# Patient Record
Sex: Female | Born: 1970 | Race: White | Hispanic: No | Marital: Single | Smoking: Former smoker
Health system: Southern US, Community
[De-identification: ages and names within clinical notes are randomized; demographics above are authoritative.]

## PROBLEM LIST (undated history)

## (undated) DIAGNOSIS — W57XXXA Bitten or stung by nonvenomous insect and other nonvenomous arthropods, initial encounter: Secondary | ICD-10-CM

## (undated) DIAGNOSIS — M722 Plantar fascial fibromatosis: Secondary | ICD-10-CM

## (undated) DIAGNOSIS — E669 Obesity, unspecified: Secondary | ICD-10-CM

## (undated) DIAGNOSIS — I10 Essential (primary) hypertension: Secondary | ICD-10-CM

## (undated) DIAGNOSIS — O039 Complete or unspecified spontaneous abortion without complication: Secondary | ICD-10-CM

## (undated) HISTORY — DX: Plantar fascial fibromatosis: M72.2

## (undated) HISTORY — DX: Complete or unspecified spontaneous abortion without complication: O03.9

## (undated) HISTORY — DX: Essential (primary) hypertension: I10

## (undated) HISTORY — DX: Obesity, unspecified: E66.9

---

## 2000-08-30 ENCOUNTER — Other Ambulatory Visit: Admission: RE | Admit: 2000-08-30 | Discharge: 2000-08-30 | Payer: Self-pay | Admitting: Obstetrics and Gynecology

## 2003-01-22 ENCOUNTER — Other Ambulatory Visit: Admission: RE | Admit: 2003-01-22 | Discharge: 2003-01-22 | Payer: Self-pay | Admitting: Obstetrics & Gynecology

## 2003-07-11 ENCOUNTER — Emergency Department (HOSPITAL_COMMUNITY): Admission: EM | Admit: 2003-07-11 | Discharge: 2003-07-11 | Payer: Self-pay | Admitting: Emergency Medicine

## 2004-12-18 ENCOUNTER — Other Ambulatory Visit: Admission: RE | Admit: 2004-12-18 | Discharge: 2004-12-18 | Payer: Self-pay | Admitting: Gynecology

## 2006-09-29 ENCOUNTER — Emergency Department (HOSPITAL_COMMUNITY): Admission: EM | Admit: 2006-09-29 | Discharge: 2006-09-29 | Payer: Self-pay | Admitting: Emergency Medicine

## 2009-04-30 ENCOUNTER — Ambulatory Visit (HOSPITAL_COMMUNITY): Admission: RE | Admit: 2009-04-30 | Discharge: 2009-04-30 | Payer: Self-pay | Admitting: Obstetrics and Gynecology

## 2009-06-17 ENCOUNTER — Inpatient Hospital Stay (HOSPITAL_COMMUNITY): Admission: AD | Admit: 2009-06-17 | Discharge: 2009-06-17 | Payer: Self-pay | Admitting: Obstetrics & Gynecology

## 2009-08-18 ENCOUNTER — Inpatient Hospital Stay (HOSPITAL_COMMUNITY): Admission: AD | Admit: 2009-08-18 | Discharge: 2009-08-18 | Payer: Self-pay | Admitting: Obstetrics and Gynecology

## 2009-09-05 ENCOUNTER — Inpatient Hospital Stay (HOSPITAL_COMMUNITY): Admission: AD | Admit: 2009-09-05 | Discharge: 2009-09-13 | Payer: Self-pay | Admitting: Obstetrics and Gynecology

## 2009-09-11 ENCOUNTER — Encounter (INDEPENDENT_AMBULATORY_CARE_PROVIDER_SITE_OTHER): Payer: Self-pay | Admitting: Obstetrics and Gynecology

## 2010-04-26 ENCOUNTER — Encounter: Payer: Self-pay | Admitting: Family Medicine

## 2010-06-22 LAB — CBC
HCT: 39.9 % (ref 36.0–46.0)
HCT: 28.2 % — ABNORMAL LOW (ref 36.0–46.0)
HCT: 29.8 % — ABNORMAL LOW (ref 36.0–46.0)
Hemoglobin: 13.5 g/dL (ref 12.0–15.0)
Hemoglobin: 10.5 g/dL — ABNORMAL LOW (ref 12.0–15.0)
Hemoglobin: 11.9 g/dL — ABNORMAL LOW (ref 12.0–15.0)
MCHC: 33.9 g/dL (ref 30.0–36.0)
MCV: 94.8 fL (ref 78.0–100.0)
MCV: 95.3 fL (ref 78.0–100.0)
Platelets: 328 10*3/uL (ref 150–400)
Platelets: 347 10*3/uL (ref 150–400)
Platelets: 415 10*3/uL — ABNORMAL HIGH (ref 150–400)
RBC: 3.15 MIL/uL — ABNORMAL LOW (ref 3.87–5.11)
RDW: 14.2 % (ref 11.5–15.5)
WBC: 9.7 10*3/uL (ref 4.0–10.5)
WBC: 13.3 10*3/uL — ABNORMAL HIGH (ref 4.0–10.5)
WBC: 17.2 10*3/uL — ABNORMAL HIGH (ref 4.0–10.5)
WBC: 19.8 10*3/uL — ABNORMAL HIGH (ref 4.0–10.5)

## 2010-06-22 LAB — STREP B DNA PROBE

## 2010-06-29 LAB — URINALYSIS, ROUTINE W REFLEX MICROSCOPIC
Bilirubin Urine: NEGATIVE
Glucose, UA: NEGATIVE mg/dL
Hgb urine dipstick: NEGATIVE
pH: 7 (ref 5.0–8.0)

## 2010-06-29 LAB — FETAL FIBRONECTIN: Fetal Fibronectin: NEGATIVE

## 2010-11-20 ENCOUNTER — Emergency Department (HOSPITAL_COMMUNITY)
Admission: EM | Admit: 2010-11-20 | Discharge: 2010-11-20 | Disposition: A | Discharge status: Home / Self Care | Payer: Self-pay | Attending: Emergency Medicine | Admitting: Emergency Medicine

## 2010-11-20 DIAGNOSIS — R209 Unspecified disturbances of skin sensation: Secondary | ICD-10-CM | POA: Insufficient documentation

## 2010-11-20 DIAGNOSIS — M79609 Pain in unspecified limb: Secondary | ICD-10-CM | POA: Insufficient documentation

## 2010-11-20 DIAGNOSIS — G5620 Lesion of ulnar nerve, unspecified upper limb: Secondary | ICD-10-CM | POA: Insufficient documentation

## 2010-12-03 NOTE — Consult Note (Signed)
  NAMESKYLEE, Sierra Moreno                ACCOUNT NO.:  [id number]  MEDICAL RECORD NO.:  [id number]  LOCATION:  [facility]                         FACILITY:  [facility]  PHYSICIAN:  Juleen China IV, MDDATE OF BIRTH:  06/21/1970  DATE OF CONSULTATION:  11/20/2010 DATE OF DISCHARGE:  11/20/2010                                CONSULTATION   REASON FOR CONSULT:  Possible vascular occlusion.  CONSULTING SERVICE:  Emergency Department.  HISTORY:  This is a 40 year old female who initially presented to [facility] with complaints of her left hand being numb and arm pain.  There is a family history of vascular disease so she was transferred to [facility] for further evaluation.  The patient states that when she woke up this morning her hand was numb and she had severe pain. She is having trouble moving her arm.  She has never had any histories like this before.  There were no aggravating or relieving symptoms.  She called 9-1-1 was taken by EMS to [facility] and was subsequently transferred to [facility].  Upon arrival here, her symptoms have nearly resolved.  She does complain of swelling in her arm.  She denies having any chest pain.  She denies any shortness of breath.  REVIEW OF SYSTEMS:  Negative for fevers, chills.  Negative for shortness of breath and chest pain.  All other review of systems are negative except what is documented in the HPI.  PAST MEDICAL HISTORY:  None.  ALLERGIES:  None.  SOCIAL HISTORY:  Positive for tobacco.  FAMILY HISTORY:  Positive for congestive heart failure and coronary artery disease in her mother.  MEDICATIONS:  None.  PHYSICAL EXAMINATION:  VITAL SIGNS:  She is afebrile.  Hemodynamically stable. GENERAL:  She is well-appearing in no acute distress. HEENT:  Within normal limits. RESPIRATIONS:  Nonlabored. CARDIOVASCULAR:  Heart rhythm is regular.  She has palpable radial pulse and palpable pedal pulses. EXTREMITIES:  Mild left arm edema. NEUROLOGIC:   Normal grip strength in the left arm and normal sensation. SKIN:  Without rash.  DIAGNOSTIC STUDIES:  I ordered an arterial and venous duplex ultrasound. The left upper arm venous study shows no evidence of DVT or superficial thrombus.  Left upper extremity arterial study shows triphasic waveforms in the radial and ulnar arteries.  Digital tracings are similar.  ASSESSMENT/PLAN:  Left arm pain:  I do not believe her symptoms are vascular in etiology.  With normal arterial tracings and a negative venous study, I think this is not a vascular issue.  I  discussed with her the importance of smoking cessation.  I will defer her to the emergency department for further workup and evaluation as deemed necessary.     Jorge Ny, MD     VWB/MEDQ  D:  11/20/2010  T:  11/21/2010  Job:  [id number]  Electronically Signed by Arelia Longest IV MD on 12/03/2010 12:05:41 AM

## 2011-04-09 ENCOUNTER — Ambulatory Visit (INDEPENDENT_AMBULATORY_CARE_PROVIDER_SITE_OTHER): Payer: BC Managed Care – PPO | Admitting: Surgery

## 2011-04-12 ENCOUNTER — Encounter (INDEPENDENT_AMBULATORY_CARE_PROVIDER_SITE_OTHER): Payer: Self-pay | Admitting: Surgery

## 2012-06-14 ENCOUNTER — Encounter (INDEPENDENT_AMBULATORY_CARE_PROVIDER_SITE_OTHER): Payer: Self-pay | Admitting: Surgery

## 2012-06-14 ENCOUNTER — Other Ambulatory Visit (INDEPENDENT_AMBULATORY_CARE_PROVIDER_SITE_OTHER): Payer: Self-pay

## 2012-06-14 ENCOUNTER — Ambulatory Visit (INDEPENDENT_AMBULATORY_CARE_PROVIDER_SITE_OTHER): Payer: BC Managed Care – PPO | Admitting: Surgery

## 2012-06-14 ENCOUNTER — Other Ambulatory Visit (INDEPENDENT_AMBULATORY_CARE_PROVIDER_SITE_OTHER): Payer: BC Managed Care – PPO

## 2012-06-14 DIAGNOSIS — Z6841 Body Mass Index (BMI) 40.0 and over, adult: Secondary | ICD-10-CM

## 2012-06-14 DIAGNOSIS — Z1231 Encounter for screening mammogram for malignant neoplasm of breast: Secondary | ICD-10-CM

## 2012-06-14 NOTE — Progress Notes (Signed)
Re:   Sierra Moreno DOB:   1970-08-16 MRN:   [id number]  ASSESSMENT AND PLAN: 1.  Morbid obesity, Weight - 307, BMI - 54.5  Per the 1991 NIH Consensus Statement, the patient is a candidate for bariatric surgery.  The patient attended our initial information session and reviewed the types of bariatric surgery.    The patient is interested in the Roux en Y Gastric Bypass.  I discussed with the patient the indications and risks of bariatric surgery.  The potential risks of surgery include, but are not limited to, bleeding, infection, leak from the bowel, DVT and PE, open surgery, long term nutrition consequences, and death.  The patient understands the importance of compliance and long term follow-up with our group after surgery.  From here we will obtain lab tests (labs done at Dr. Cynda Acres), x-rays, nutrition consult, and psych consult.  2.  Plantar fasciitis  3.  Smokes 1/2 ppd  Has to quit before surgery  Chief Complaint  Patient presents with  . New Evaluation    gastric bypass initial   REFERRING PHYSICIAN: NNODI, ADAKU, MD  HISTORY OF PRESENT ILLNESS: Sierra Moreno is a 42 y.o. (DOB: 09-24-1970)  white  female whose primary care physician is NNODI, ADAKU, MD and comes to me today for evaluation for bariatric surgery.  Her aunt Halina Andreas is with her.  The patient has been to our information sessions 3 times. She has, however, not heard me talk.  She is interested in a gastric bypass. She has tried multiple diets in her adult life. She has tried Weight Watchers, Tops, Slim fast, and other low-calorie diets. She was on Phen-Fen which she lost 100 pounds. She also took Product/process development scientist (energy pills - ephedrine) in 2003 and successfully lost about 100 pounds. She has tried phentermine without success.  She recently had blood work at Dr. Langston Reusing office. It sounds like she eats out a lot - particularly at fast food places. She said that she would quit smoking before her child was born, but was not  able to do it.    Past Medical History  Diagnosis Date  . Miscarriage 2000,2001  . Premature birth 2011    34 Weeks.  . Plantar fasciitis, bilateral     With heel spurs.  . Obesity      History reviewed. No pertinent past surgical history.    Current Outpatient Prescriptions  Medication Sig Dispense Refill  . Cholecalciferol (VITAMIN D) 2000 UNITS tablet Take 2,000 Units by mouth daily.      . diclofenac (VOLTAREN) 75 MG EC tablet Take 75 mg by mouth 2 (two) times daily.      . fluconazole (DIFLUCAN) 150 MG tablet Take 150 mg by mouth once. Once weekly X 8 weeks.      . Multiple Vitamins-Minerals (MULTIVITAMIN WITH MINERALS) tablet Take 1 tablet by mouth daily.       No current facility-administered medications for this visit.      Allergies  Allergen Reactions  . Lamisil (Terbinafine Hcl) Hives    REVIEW OF SYSTEMS: Skin:  No history of rash.  No history of abnormal moles. Infection:  No history of hepatitis or HIV.  No history of MRSA. Neurologic:  No history of stroke.  No history of seizure.  No history of headaches. Cardiac:  No history of hypertension. No history of heart disease.  No history of prior cardiac catheterization.  No history of seeing a cardiologist. Pulmonary:  Smokes and knows that she needs  to quit.  Endocrine:  No diabetes. No thyroid disease. Gastrointestinal:  No history of stomach disease.  No history of liver disease.  No history of gall bladder disease.  No history of pancreas disease.  No history of colon disease.  She has had some hemorrhoids. Urologic:  No history of kidney stones.  No history of bladder infections. Musculoskeletal:  History of back issues, which are coming back.  Issues with her feet - she sees [employer], Risk analyst, in [place]. Hematologic:  No bleeding disorder.  No history of anemia.  Not anticoagulated. Psycho-social:  The patient is oriented.   The patient has no obvious psychologic or social impairment to  understanding our conversation and plan.  SOCIAL and FAMILY HISTORY: Divorced. Has one daughter, 2 1/2. Her aunt Halina Andreas is with her.  PHYSICAL EXAM: BP 132/84  Pulse 80  Temp(Src) 98.9 F (37.2 C) (Temporal)  Resp 18  Ht 5\' 3"  (1.6 m)  Wt 307 lb 6.4 oz (139.436 kg)  BMI 54.47 kg/m2  General: WN obese WF who is alert and generally healthy appearing.  HEENT: Normal. Pupils equal. Neck: Supple. No mass.  No thyroid mass. Lymph Nodes:  No supraclavicular or cervical nodes. Lungs: Clear to auscultation and symmetric breath sounds. Heart:  RRR. No murmur or rub. Abdomen: Soft. No mass. No tenderness. No hernia. Normal bowel sounds.  No abdominal scars.  Slight more apple than pear. Rectal: External hemorrhoids.  Guaiac negative. Extremities:  Good strength and ROM  in upper and lower extremities. Neurologic:  Grossly intact to motor and sensory function. Psychiatric: Has normal mood and affect. Behavior is normal.   DATA REVIEWED: None in Epic.  Ovidio Kin, MD,  [facility] [address]  [address]   [place], [place]    [address] Phone:  [phone] FAX:  [phone]

## 2012-06-26 ENCOUNTER — Encounter (INDEPENDENT_AMBULATORY_CARE_PROVIDER_SITE_OTHER): Payer: Self-pay

## 2012-06-28 ENCOUNTER — Encounter (HOSPITAL_COMMUNITY)
Admission: RE | Disposition: A | Discharge status: Home / Self Care | Payer: Self-pay | Source: Ambulatory Visit | Attending: Surgery

## 2012-06-28 ENCOUNTER — Ambulatory Visit (HOSPITAL_COMMUNITY)
Admission: RE | Admit: 2012-06-28 | Discharge: 2012-06-28 | Disposition: A | Discharge status: Home / Self Care | Payer: BC Managed Care – PPO | Source: Ambulatory Visit | Attending: Surgery | Admitting: Surgery

## 2012-06-28 HISTORY — PX: BREATH TEK H PYLORI: SHX5422

## 2012-06-28 LAB — COMPREHENSIVE METABOLIC PANEL
ALT: 20 U/L (ref 0–35)
AST: 24 U/L (ref 0–37)
Albumin: 3.9 g/dL (ref 3.5–5.2)
Alkaline Phosphatase: 63 U/L (ref 39–117)
CO2: 22 mEq/L (ref 19–32)
Calcium: 8.7 mg/dL (ref 8.4–10.5)
Chloride: 106 mEq/L (ref 96–112)
Creat: 0.64 mg/dL (ref 0.50–1.10)
Glucose, Bld: 93 mg/dL (ref 70–99)
Potassium: 4.6 mEq/L (ref 3.5–5.3)
Total Bilirubin: 0.3 mg/dL (ref 0.3–1.2)

## 2012-06-28 LAB — CBC WITH DIFFERENTIAL/PLATELET
Basophils Relative: 1 % (ref 0–1)
Lymphs Abs: 1.7 10*3/uL (ref 0.7–4.0)
MCHC: 34.1 g/dL (ref 30.0–36.0)
MCV: 89 fL (ref 78.0–100.0)
Neutrophils Relative %: 63 % (ref 43–77)
Platelets: 391 10*3/uL (ref 150–400)
WBC: 6.6 10*3/uL (ref 4.0–10.5)

## 2012-06-28 SURGERY — BREATH TEST, FOR HELICOBACTER PYLORI

## 2012-06-29 ENCOUNTER — Telehealth (INDEPENDENT_AMBULATORY_CARE_PROVIDER_SITE_OTHER): Payer: Self-pay | Admitting: *Deleted

## 2012-06-29 ENCOUNTER — Ambulatory Visit (HOSPITAL_COMMUNITY): Admission: RE | Admit: 2012-06-29 | Payer: BC Managed Care – PPO | Source: Ambulatory Visit

## 2012-06-29 ENCOUNTER — Encounter (HOSPITAL_COMMUNITY): Payer: Self-pay | Admitting: Surgery

## 2012-06-29 ENCOUNTER — Ambulatory Visit (HOSPITAL_COMMUNITY): Payer: Self-pay

## 2012-06-29 NOTE — Telephone Encounter (Signed)
FYI: Radiology department called to let us know that patient did not show up this morning for her MM, UGI, and [place].

## 2012-07-04 ENCOUNTER — Telehealth (INDEPENDENT_AMBULATORY_CARE_PROVIDER_SITE_OTHER): Payer: Self-pay

## 2012-07-04 ENCOUNTER — Other Ambulatory Visit (INDEPENDENT_AMBULATORY_CARE_PROVIDER_SITE_OTHER): Payer: Self-pay

## 2012-07-04 NOTE — Telephone Encounter (Signed)
V/M to call concerning missed schedule test/procedures to move forward with gastric bypass.

## 2012-07-05 ENCOUNTER — Encounter: Payer: Self-pay | Admitting: *Deleted

## 2012-07-05 ENCOUNTER — Encounter: Payer: BC Managed Care – PPO | Attending: Surgery | Admitting: *Deleted

## 2012-07-05 DIAGNOSIS — Z713 Dietary counseling and surveillance: Secondary | ICD-10-CM | POA: Insufficient documentation

## 2012-07-05 DIAGNOSIS — E669 Obesity, unspecified: Secondary | ICD-10-CM | POA: Insufficient documentation

## 2012-07-05 DIAGNOSIS — Z01818 Encounter for other preprocedural examination: Secondary | ICD-10-CM | POA: Insufficient documentation

## 2012-07-05 NOTE — Patient Instructions (Addendum)
   Follow Pre-Op Nutrition Goals to prepare for your Gastric Bypass Surgery.   Call the [facility] at [phone] once you have been given your surgery date to enrolled in the Pre-Op Nutrition Class. You will need to attend this nutrition class 3-4 weeks prior to your surgery.

## 2012-07-05 NOTE — Progress Notes (Signed)
  Pre-Op Assessment Visit:  Pre-Operative RYGB Surgery  Medical Nutrition Therapy:  Appt start time: 0900   End time:  1000.  Patient was seen on 07/05/2012 for Pre-Operative RYGB Nutrition Assessment. Assessment and letter of approval faxed to [facility] Program coordinator on 07/05/2012.  Approval letter sent to [facility] and will be available in the chart under the media tab.  Handouts given during visit include:  Pre-Op Goals   Bariatric Surgery Protein Shakes  Patient to call for Pre-Op and Post-Op Nutrition Education at the [facility] when surgery is scheduled.

## 2012-07-11 ENCOUNTER — Encounter (INDEPENDENT_AMBULATORY_CARE_PROVIDER_SITE_OTHER): Payer: Self-pay

## 2012-07-14 ENCOUNTER — Ambulatory Visit (HOSPITAL_COMMUNITY)
Admission: RE | Admit: 2012-07-14 | Discharge: 2012-07-14 | Disposition: A | Discharge status: Home / Self Care | Payer: BC Managed Care – PPO | Source: Ambulatory Visit | Attending: Surgery | Admitting: Surgery

## 2012-07-14 ENCOUNTER — Other Ambulatory Visit: Payer: Self-pay

## 2012-07-14 DIAGNOSIS — Z1231 Encounter for screening mammogram for malignant neoplasm of breast: Secondary | ICD-10-CM

## 2012-07-14 DIAGNOSIS — Z6841 Body Mass Index (BMI) 40.0 and over, adult: Secondary | ICD-10-CM | POA: Insufficient documentation

## 2012-07-14 DIAGNOSIS — M722 Plantar fascial fibromatosis: Secondary | ICD-10-CM | POA: Insufficient documentation

## 2012-07-14 DIAGNOSIS — F17200 Nicotine dependence, unspecified, uncomplicated: Secondary | ICD-10-CM | POA: Insufficient documentation

## 2013-01-24 ENCOUNTER — Other Ambulatory Visit (INDEPENDENT_AMBULATORY_CARE_PROVIDER_SITE_OTHER): Payer: Self-pay

## 2013-01-24 DIAGNOSIS — F17200 Nicotine dependence, unspecified, uncomplicated: Secondary | ICD-10-CM

## 2013-03-13 ENCOUNTER — Other Ambulatory Visit (INDEPENDENT_AMBULATORY_CARE_PROVIDER_SITE_OTHER): Payer: Self-pay | Admitting: Surgery

## 2013-03-14 ENCOUNTER — Ambulatory Visit (INDEPENDENT_AMBULATORY_CARE_PROVIDER_SITE_OTHER): Payer: BC Managed Care – PPO | Admitting: Surgery

## 2013-03-15 ENCOUNTER — Telehealth (INDEPENDENT_AMBULATORY_CARE_PROVIDER_SITE_OTHER): Payer: Self-pay

## 2013-03-15 NOTE — Telephone Encounter (Signed)
V/M X 2 appt changed to 12/24 8am with DR. Ezzard Standing ; Dr. Ezzard Standing is needing to see patient before 04/06/13

## 2013-03-26 ENCOUNTER — Ambulatory Visit (HOSPITAL_COMMUNITY): Admission: RE | Admit: 2013-03-26 | Payer: BC Managed Care – PPO | Source: Ambulatory Visit | Admitting: Surgery

## 2013-03-26 ENCOUNTER — Encounter (HOSPITAL_COMMUNITY): Admission: RE | Payer: Self-pay | Source: Ambulatory Visit

## 2013-03-26 SURGERY — LAPAROSCOPIC ROUX-EN-Y GASTRIC BYPASS WITH UPPER ENDOSCOPY
Anesthesia: General

## 2013-03-28 ENCOUNTER — Ambulatory Visit (INDEPENDENT_AMBULATORY_CARE_PROVIDER_SITE_OTHER): Payer: Medicaid Other | Admitting: Surgery

## 2013-04-03 ENCOUNTER — Other Ambulatory Visit (HOSPITAL_COMMUNITY): Payer: BC Managed Care – PPO

## 2013-04-06 ENCOUNTER — Ambulatory Visit (INDEPENDENT_AMBULATORY_CARE_PROVIDER_SITE_OTHER): Payer: Medicaid Other | Admitting: Surgery

## 2013-04-06 ENCOUNTER — Encounter (INDEPENDENT_AMBULATORY_CARE_PROVIDER_SITE_OTHER): Payer: BC Managed Care – PPO | Admitting: Surgery

## 2013-04-19 ENCOUNTER — Ambulatory Visit (INDEPENDENT_AMBULATORY_CARE_PROVIDER_SITE_OTHER): Payer: Medicaid Other | Admitting: Surgery

## 2013-04-19 ENCOUNTER — Other Ambulatory Visit (INDEPENDENT_AMBULATORY_CARE_PROVIDER_SITE_OTHER): Payer: Self-pay

## 2013-04-24 ENCOUNTER — Telehealth: Payer: Self-pay | Admitting: Dietician

## 2013-04-24 NOTE — Telephone Encounter (Signed)
Sent Rollande the Pre-Op diet to follow for 2 weeks before surgery and the Post-Op diet to follow 2 weeks after RYGB.

## 2013-04-26 ENCOUNTER — Ambulatory Visit (INDEPENDENT_AMBULATORY_CARE_PROVIDER_SITE_OTHER): Payer: BC Managed Care – PPO | Admitting: Surgery

## 2013-04-26 ENCOUNTER — Inpatient Hospital Stay (HOSPITAL_COMMUNITY): Admission: RE | Admit: 2013-04-26 | Payer: Medicaid Other | Source: Ambulatory Visit

## 2013-04-30 ENCOUNTER — Inpatient Hospital Stay (HOSPITAL_COMMUNITY): Admission: RE | Admit: 2013-04-30 | Payer: Medicaid Other | Source: Ambulatory Visit | Admitting: Surgery

## 2013-04-30 ENCOUNTER — Encounter (HOSPITAL_COMMUNITY): Admission: RE | Payer: Self-pay | Source: Ambulatory Visit

## 2013-04-30 SURGERY — LAPAROSCOPIC ROUX-EN-Y GASTRIC BYPASS WITH UPPER ENDOSCOPY
Anesthesia: General

## 2013-06-02 IMAGING — RF DG UGI W/ KUB
15 of 22 series · 15 of 22 positions shown · non-contrast
Comparison: None

CLINICAL DATA: Morbid obesity.  Bariatric screening. No difficulty
swallowing or previous abdominal surgeries.

UPPER GI SERIES WITH KUB
TECHNIQUE: Routine upper GI series was performed with thin and
high density barium.
Fluoroscopy Time: 1.3 minutes

[Series 1: run · 1 of 1 slices shown (1 of 14)]
[im 1/1]
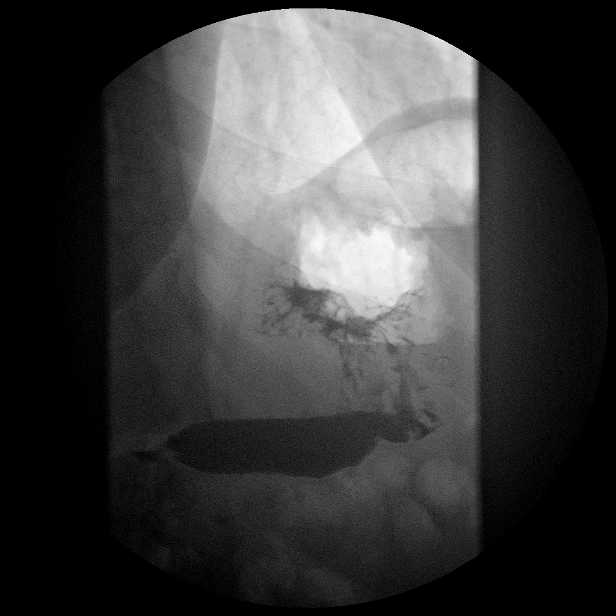

[Series 3: run · 1 of 1 slices shown (2 of 14)]
[im 1/1]
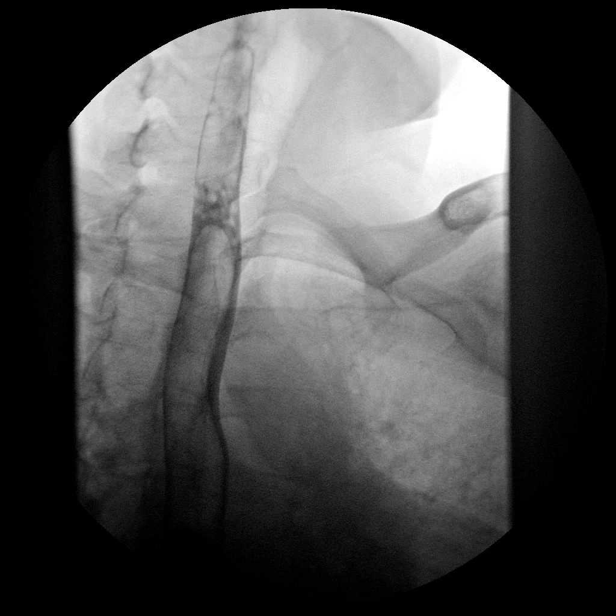

[Series 4: run · 1 of 1 slices shown (3 of 14)]
[im 1/1]
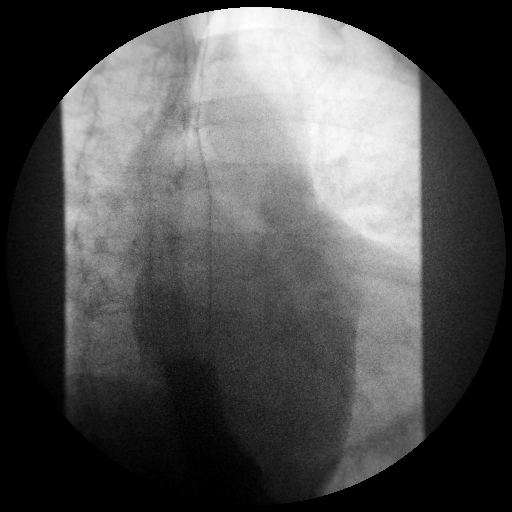

[Series 6: run · 1 of 1 slices shown (4 of 14)]
[im 1/1]
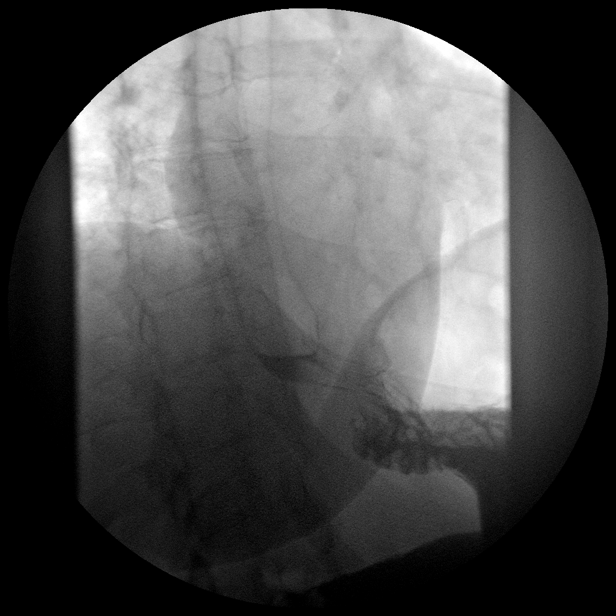

[Series 7: run · 1 of 1 slices shown (5 of 14)]
[im 1/1]
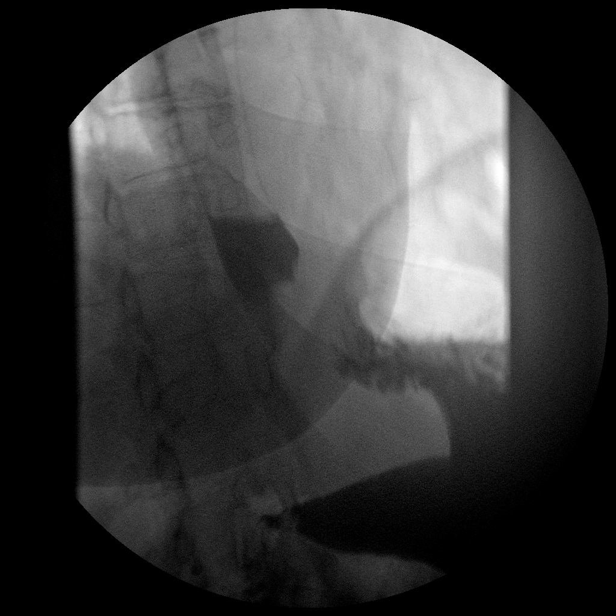

[Series 9: run · 1 of 1 slices shown (6 of 14)]
[im 1/1]
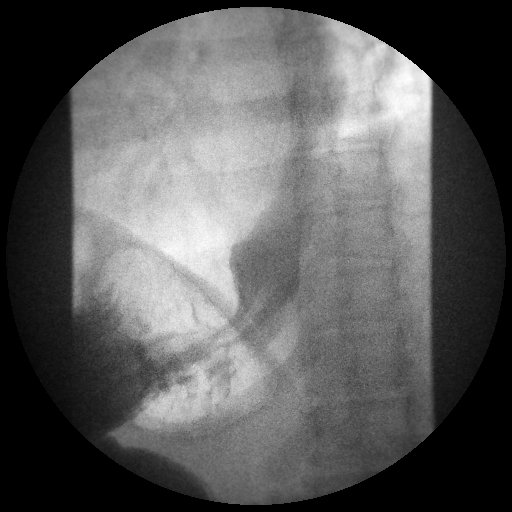

[Series 10: run · 1 of 1 slices shown (7 of 14)]
[im 1/1]
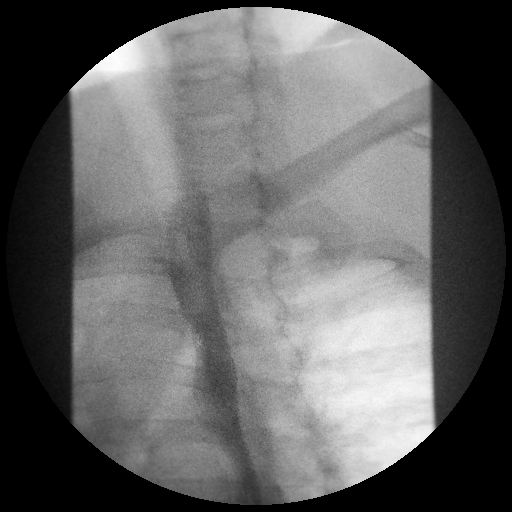

[Series 12: run · 1 of 1 slices shown (8 of 14)]
[im 1/1]
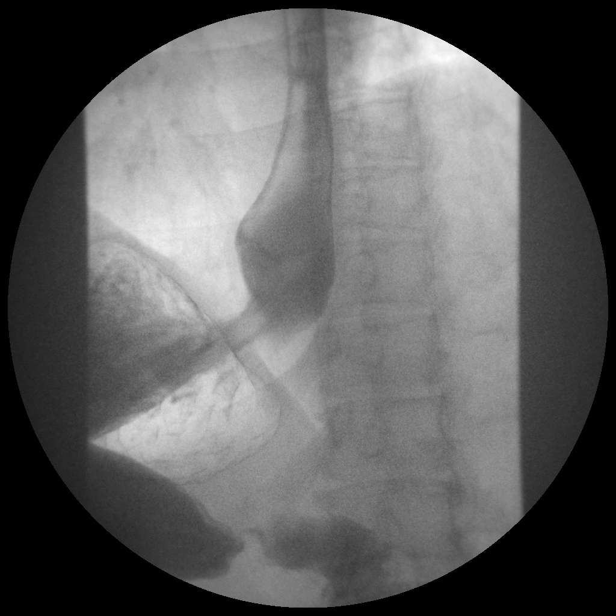

[Series 13: run · 1 of 1 slices shown (9 of 14)]
[im 1/1]
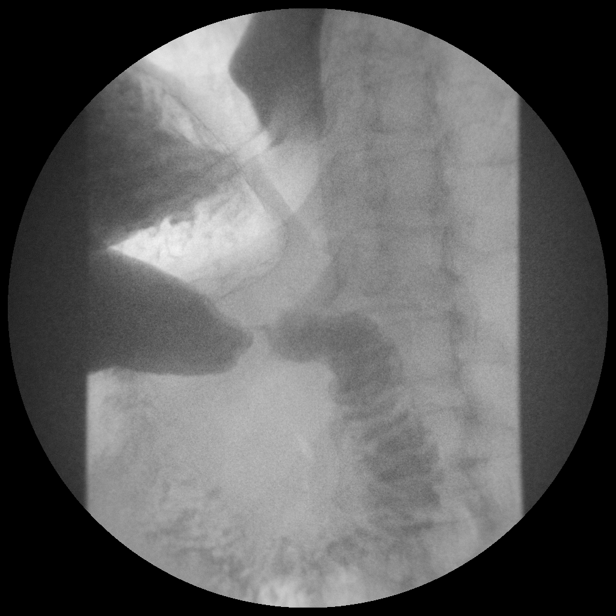

[Series 14: run · 1 of 1 slices shown (10 of 14)]
[im 1/1]
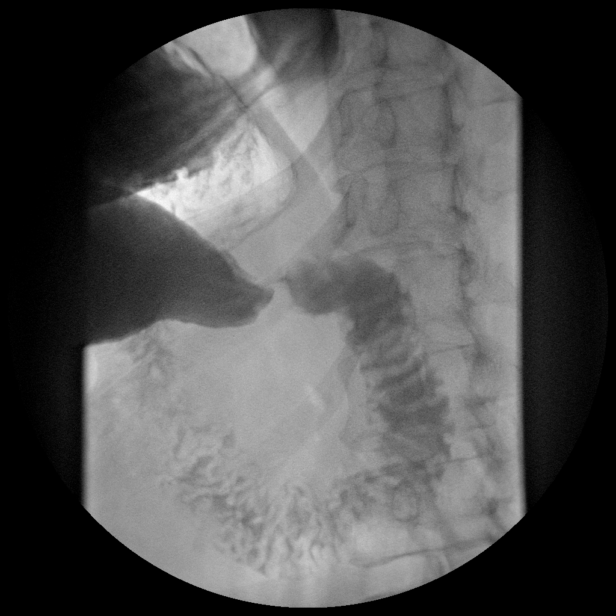

[Series 16: run · 1 of 1 slices shown (11 of 14)]
[im 1/1]
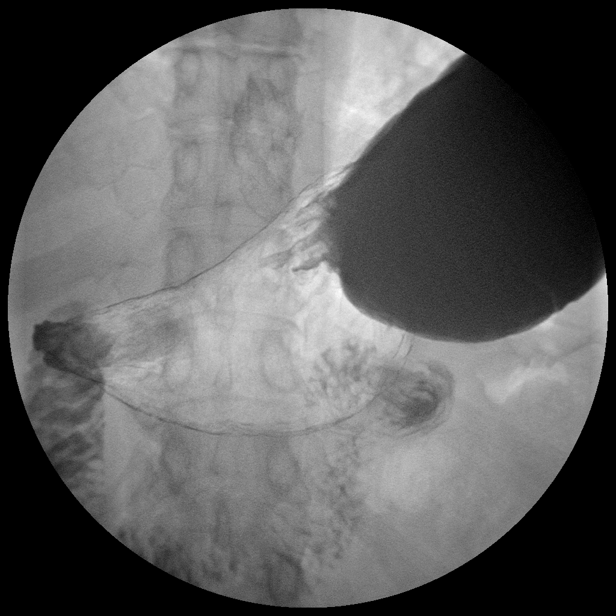

[Series 17: run · 1 of 1 slices shown (12 of 14)]
[im 1/1]
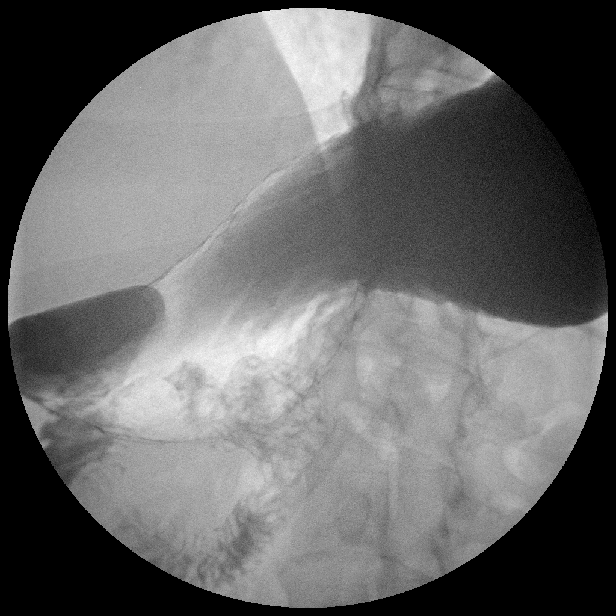

[Series 19: run · 1 of 1 slices shown (13 of 14)]
[im 1/1]
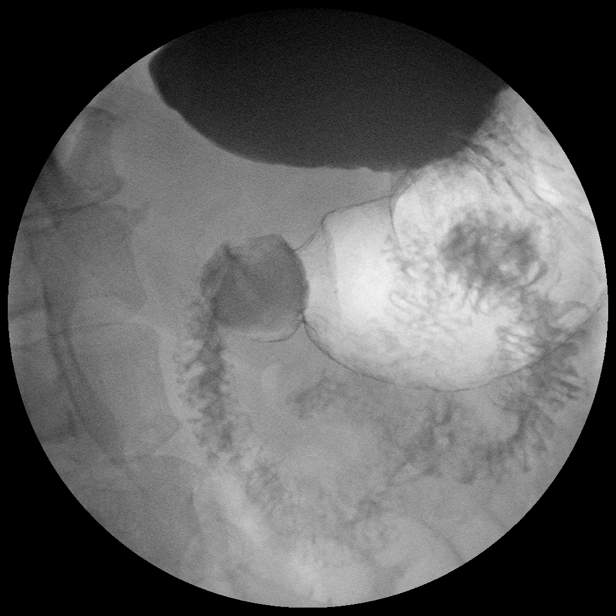

[Series 20: run · 1 of 1 slices shown (14 of 14)]
[im 1/1]
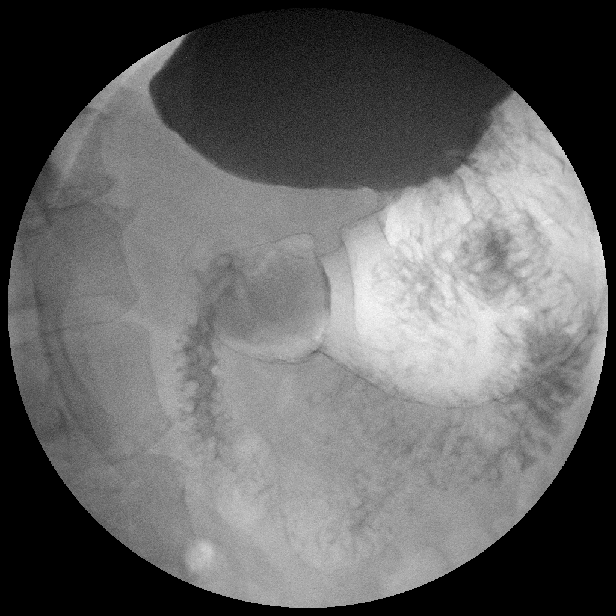

[Series 1002: view not recorded · 0.20mm/px · 1 of 1 slices shown]
[im 1/1]
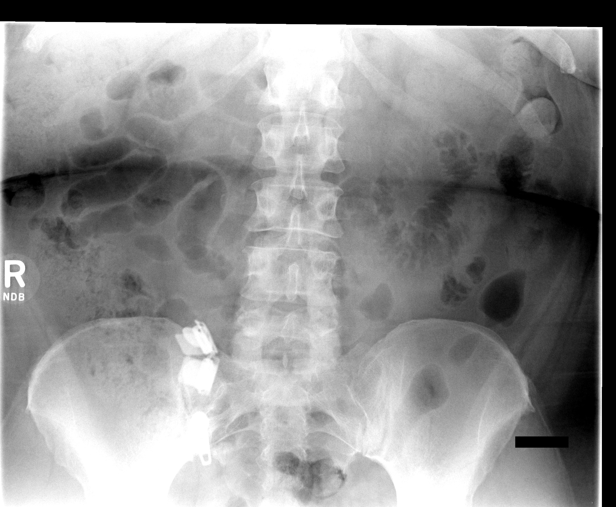

[15 of 22 positions shown; findings below may reference images not displayed]

FINDINGS: Scout film shows a nonobstructive bowel gas pattern.  A
Visualized osseous structures have a normal appearance.

With administration of contrast, there is normal appearance of the
esophagus.  Peristalsis is normal.  Gastroesophageal junction opens
widely.  No hiatal hernia identified.

The gastric rugal fold pattern and mucosal appearance are normal.
The duodenal bulb and sweep are normal. No evidence for
malrotation.
IMPRESSION: Normal upper GI exam.

## 2013-10-18 ENCOUNTER — Other Ambulatory Visit (HOSPITAL_COMMUNITY)
Admission: RE | Admit: 2013-10-18 | Discharge: 2013-10-18 | Disposition: A | Discharge status: Home / Self Care | Payer: Medicaid Other | Source: Ambulatory Visit | Attending: Family Medicine | Admitting: Family Medicine

## 2013-10-18 ENCOUNTER — Other Ambulatory Visit: Payer: Self-pay | Admitting: Family Medicine

## 2013-10-18 DIAGNOSIS — Z124 Encounter for screening for malignant neoplasm of cervix: Secondary | ICD-10-CM | POA: Insufficient documentation

## 2013-10-18 DIAGNOSIS — Z1151 Encounter for screening for human papillomavirus (HPV): Secondary | ICD-10-CM | POA: Insufficient documentation

## 2013-10-18 DIAGNOSIS — Z113 Encounter for screening for infections with a predominantly sexual mode of transmission: Secondary | ICD-10-CM | POA: Insufficient documentation

## 2013-10-18 DIAGNOSIS — R87810 Cervical high risk human papillomavirus (HPV) DNA test positive: Secondary | ICD-10-CM | POA: Insufficient documentation

## 2013-10-22 LAB — CYTOLOGY - PAP

## 2014-05-04 ENCOUNTER — Encounter (HOSPITAL_COMMUNITY): Payer: Self-pay | Admitting: Emergency Medicine

## 2014-05-04 ENCOUNTER — Emergency Department (HOSPITAL_COMMUNITY): Payer: Medicaid Other

## 2014-05-04 ENCOUNTER — Emergency Department (HOSPITAL_COMMUNITY)
Admission: EM | Admit: 2014-05-04 | Discharge: 2014-05-04 | Disposition: A | Discharge status: Home / Self Care | Payer: Self-pay | Attending: Emergency Medicine | Admitting: Emergency Medicine

## 2014-05-04 ENCOUNTER — Other Ambulatory Visit: Payer: Self-pay

## 2014-05-04 DIAGNOSIS — Z791 Long term (current) use of non-steroidal anti-inflammatories (NSAID): Secondary | ICD-10-CM | POA: Insufficient documentation

## 2014-05-04 DIAGNOSIS — R0789 Other chest pain: Secondary | ICD-10-CM | POA: Insufficient documentation

## 2014-05-04 DIAGNOSIS — R079 Chest pain, unspecified: Secondary | ICD-10-CM

## 2014-05-04 DIAGNOSIS — E669 Obesity, unspecified: Secondary | ICD-10-CM | POA: Insufficient documentation

## 2014-05-04 DIAGNOSIS — Z87891 Personal history of nicotine dependence: Secondary | ICD-10-CM | POA: Insufficient documentation

## 2014-05-04 DIAGNOSIS — Z8739 Personal history of other diseases of the musculoskeletal system and connective tissue: Secondary | ICD-10-CM | POA: Insufficient documentation

## 2014-05-04 DIAGNOSIS — Z79899 Other long term (current) drug therapy: Secondary | ICD-10-CM | POA: Insufficient documentation

## 2014-05-04 LAB — I-STAT TROPONIN, ED: Troponin i, poc: 0 ng/mL (ref 0.00–0.08)

## 2014-05-04 MED ORDER — HYDROCODONE-ACETAMINOPHEN 5-325 MG PO TABS: 1.0000 | ORAL_TABLET | Freq: Four times a day (QID) | ORAL | Status: DC | PRN

## 2014-05-04 MED ORDER — IBUPROFEN 800 MG PO TABS
800.0000 mg | ORAL_TABLET | Freq: Once | ORAL | Status: AC
Start: 2014-05-04 — End: 2014-05-04
  Administered 2014-05-04: 800 mg via ORAL
  Filled 2014-05-04: qty 1

## 2014-05-04 MED ORDER — CYCLOBENZAPRINE HCL 10 MG PO TABS
10.0000 mg | ORAL_TABLET | Freq: Once | ORAL | Status: AC
  Administered 2014-05-04: 10 mg via ORAL
  Filled 2014-05-04: qty 1

## 2014-05-04 MED ORDER — DIAZEPAM 5 MG PO TABS: 5.0000 mg | ORAL_TABLET | Freq: Four times a day (QID) | ORAL | Status: DC | PRN

## 2014-05-04 MED ORDER — IBUPROFEN 800 MG PO TABS: 800.0000 mg | ORAL_TABLET | Freq: Three times a day (TID) | ORAL | Status: DC

## 2014-05-04 NOTE — ED Provider Notes (Signed)
CSN: [id number]     Arrival date & time 05/04/14  1318 History   First MD Initiated Contact with Patient 05/04/14 1337     Chief Complaint  Patient presents with  . Shoulder Pain  . Chest Pain     (Consider location/radiation/quality/duration/timing/severity/associated sxs/prior Treatment) Patient is a 44 y.o. female presenting with shoulder pain and chest pain.  Shoulder Pain Location:  Arm and shoulder Time since incident:  1 week Injury: yes   Mechanism of injury comment:  Heavy lifting Shoulder location:  L shoulder Arm location:  L upper arm Pain details:    Quality:  Aching   Radiates to:  Does not radiate   Severity:  Moderate   Onset quality:  Gradual   Duration:  1 week   Timing:  Constant   Progression:  Unchanged Chronicity:  New Relieved by: certain positions. Exacerbated by: certain positions, movement. Associated symptoms: no fever   Chest Pain Associated symptoms: no cough, no fever and no shortness of breath     Past Medical History  Diagnosis Date  . Miscarriage 2000,2001  . Premature birth 2011    34 Weeks.  . Plantar fasciitis, bilateral     With heel spurs.  . Obesity    Past Surgical History  Procedure Laterality Date  . Breath tek h pylori N/A 06/28/2012    Procedure: BREATH TEK H PYLORI;  Surgeon: Kandis Cockingavid H Newman, MD;  Location: [facility];  Service: General;  Laterality: N/A;   Family History  Problem Relation Age of Onset  . Diabetes Mother   . Congestive Heart Failure Mother   . COPD Father   . Cancer Maternal Aunt     Breast  . Cancer Maternal Aunt     Breast   History  Substance Use Topics  . Smoking status: Former Smoker -- 0.50 packs/day    Types: Cigarettes    Quit date: 06/28/2012  . Smokeless tobacco: Never Used     Comment: Patient states she knows she has to quit prior to sugery. Trying to quit. Has went from 1 pack/day to 0.50 pack or less daily.  . Alcohol Use: No   OB History    No data available     Review  of Systems  Constitutional: Negative for fever.  Respiratory: Negative for cough and shortness of breath.   Cardiovascular: Positive for chest pain.  All other systems reviewed and are negative.     Allergies  Lamisil  Home Medications   Prior to Admission medications   Medication Sig Start Date End Date Taking? Authorizing Provider  acetaminophen (TYLENOL) 500 MG tablet Take 1,000 mg by mouth every 6 (six) hours as needed (pain).   Yes Historical Provider, MD  cyclobenzaprine (FLEXERIL) 10 MG tablet Take 10 mg by mouth 3 (three) times daily as needed for muscle spasms.   Yes Historical Provider, MD  diclofenac (VOLTAREN) 75 MG EC tablet Take 75 mg by mouth 2 (two) times daily.   Yes Historical Provider, MD  HYDROcodone-acetaminophen (NORCO/VICODIN) 5-325 MG per tablet Take 1 tablet by mouth every 6 (six) hours as needed for pain.   Yes Historical Provider, MD  Multiple Vitamins-Minerals (MULTIVITAMIN WITH MINERALS) tablet Take 1 tablet by mouth daily.   Yes Historical Provider, MD  Naproxen Sodium (ALEVE) 220 MG CAPS Take 660 mg by mouth 2 (two) times daily as needed (pain).    Yes Historical Provider, MD   BP 171/103 mmHg  Pulse 91  Temp(Src) 97.8 F (36.6 C) (  Oral)  Resp 18  SpO2 99%  LMP 04/22/2014 Physical Exam  Constitutional: She is oriented to person, place, and time. She appears well-developed and well-nourished. No distress.  HENT:  Head: Normocephalic and atraumatic.  Mouth/Throat: Oropharynx is clear and moist.  Eyes: EOM are normal. Pupils are equal, round, and reactive to light.  Neck: Normal range of motion. Neck supple.  Cardiovascular: Normal rate and regular rhythm.  Exam reveals no friction rub.   No murmur heard. Pulmonary/Chest: Effort normal and breath sounds normal. No respiratory distress. She has no wheezes. She has no rales. She exhibits tenderness (L anterior and upper chest).  Abdominal: Soft. She exhibits no distension. There is no tenderness.  There is no rebound.  Musculoskeletal: Normal range of motion. She exhibits no edema.       Cervical back: She exhibits tenderness (L trapezius, L upper back musculature).  Neurological: She is alert and oriented to person, place, and time.  Skin: She is not diaphoretic.  Nursing note and vitals reviewed.   ED Course  Procedures (including critical care time) Labs Review Labs Reviewed  Rosezena Sensor, ED    Imaging Review Dg Chest 2 View  05/04/2014   CLINICAL DATA:  44 year old female with left-sided chest pain and left shoulder pain radiating down the left arm into the left scapular region.  EXAM: CHEST  2 VIEW  COMPARISON:  No priors.  FINDINGS: Lung volumes are normal. No consolidative airspace disease. No pleural effusions. No pneumothorax. No pulmonary nodule or mass noted. Pulmonary vasculature and the cardiomediastinal silhouette are within normal limits.  IMPRESSION: No radiographic evidence of acute cardiopulmonary disease.   Electronically Signed   By: Trudie Reed M.D.   On: 05/04/2014 15:39   Dg Shoulder Left  05/04/2014   CLINICAL DATA:  44 year old female with left shoulder pain.  EXAM: LEFT SHOULDER - 2+ VIEW  COMPARISON:  No priors.  FINDINGS: Multiple views of the left shoulder demonstrate no acute displaced fracture, subluxation, dislocation, or soft tissue abnormality.  IMPRESSION: No acute radiographic abnormality of the left shoulder.   Electronically Signed   By: Trudie Reed M.D.   On: 05/04/2014 15:40     EKG Interpretation None       Date: 05/04/2014  Rate: 87  Rhythm: normal sinus rhythm  QRS Axis: normal  Intervals: normal  ST/T Wave abnormalities: normal  Conduction Disutrbances:none  Narrative Interpretation:   Old EKG Reviewed: unchanged    MDM   Final diagnoses:  Chest pain  Chest wall pain    89F here with L sided chest pain. All began about 1 week ago after lifting a patient multiple times. Constant since then. Worse with  movements, certain positions, better with certain positions. No fevers, cough, tingling, numbness. AFVSS here. Palpable tenderness of L anterior chest wall and L posterior chest wall, L trapezius.  Patient's exam c/w musculoskeletal pain. Will check troponin to ensure no cardiac involvement. EKG ok. Imaging ok. Given valium and vicodin.    Elwin Mocha, MD 05/04/14 [phone]

## 2014-05-04 NOTE — ED Notes (Signed)
Pt. Reports x1 week left shoulder pain radiating to left neck, left arm and into left chest. Reports pain as "sore". States she has taken flexeril and percocet with no relief. Denies dizziness, N/V. Pt. Is alert and oriented x4.

## 2014-05-04 NOTE — Discharge Instructions (Signed)
Chest Wall Pain °Chest wall pain is pain in or around the bones and muscles of your chest. It may take up to 6 weeks to get better. It may take longer if you must stay physically active in your work and activities.  °CAUSES  °Chest wall pain may happen on its own. However, it may be caused by: °· A viral illness like the flu. °· Injury. °· Coughing. °· Exercise. °· Arthritis. °· Fibromyalgia. °· Shingles. °HOME CARE INSTRUCTIONS  °· Avoid overtiring physical activity. Try not to strain or perform activities that cause pain. This includes any activities using your chest or your abdominal and side muscles, especially if heavy weights are used. °· Put ice on the sore area. °· Put ice in a plastic bag. °· Place a towel between your skin and the bag. °· Leave the ice on for 15-20 minutes per hour while awake for the first 2 days. °· Only take over-the-counter or prescription medicines for pain, discomfort, or fever as directed by your caregiver. °SEEK IMMEDIATE MEDICAL CARE IF:  °· Your pain increases, or you are very uncomfortable. °· You have a fever. °· Your chest pain becomes worse. °· You have new, unexplained symptoms. °· You have nausea or vomiting. °· You feel sweaty or lightheaded. °· You have a cough with phlegm (sputum), or you cough up blood. °MAKE SURE YOU:  °· Understand these instructions. °· Will watch your condition. °· Will get help right away if you are not doing well or get worse. °Document Released: 03/22/2005 Document Revised: 06/14/2011 Document Reviewed: 11/16/2010 °ExitCare® Patient Information ©2015 [employer]. This information is not intended to replace advice given to you by your health care provider. Make sure you discuss any questions you have with your health care provider. ° °Musculoskeletal Pain °Musculoskeletal pain is muscle and boney aches and pains. These pains can occur in any part of the body. Your caregiver may treat you without knowing the cause of the pain. They may treat you  if blood or urine tests, X-rays, and other tests were normal.  °CAUSES °There is often not a definite cause or reason for these pains. These pains may be caused by a type of germ (virus). The discomfort may also come from overuse. Overuse includes working out too hard when your body is not fit. Boney aches also come from weather changes. Bone is sensitive to atmospheric pressure changes. °HOME CARE INSTRUCTIONS  °· Ask when your test results will be ready. Make sure you get your test results. °· Only take over-the-counter or prescription medicines for pain, discomfort, or fever as directed by your caregiver. If you were given medications for your condition, do not drive, operate machinery or power tools, or sign legal documents for 24 hours. Do not drink alcohol. Do not take sleeping pills or other medications that may interfere with treatment. °· Continue all activities unless the activities cause more pain. When the pain lessens, slowly resume normal activities. Gradually increase the intensity and duration of the activities or exercise. °· During periods of severe pain, bed rest may be helpful. Lay or sit in any position that is comfortable. °· Putting ice on the injured area. °¨ Put ice in a bag. °¨ Place a towel between your skin and the bag. °¨ Leave the ice on for 15 to 20 minutes, 3 to 4 times a day. °· Follow up with your caregiver for continued problems and no reason can be found for the pain. If the pain becomes worse   or does not go away, it may be necessary to repeat tests or do additional testing. Your caregiver may need to look further for a possible cause. °SEEK IMMEDIATE MEDICAL CARE IF: °· You have pain that is getting worse and is not relieved by medications. °· You develop chest pain that is associated with shortness or breath, sweating, feeling sick to your stomach (nauseous), or throw up (vomit). °· Your pain becomes localized to the abdomen. °· You develop any new symptoms that seem different  or that concern you. °MAKE SURE YOU:  °· Understand these instructions. °· Will watch your condition. °· Will get help right away if you are not doing well or get worse. °Document Released: 03/22/2005 Document Revised: 06/14/2011 Document Reviewed: 11/24/2012 °ExitCare® Patient Information ©2015 [employer]. This information is not intended to replace advice given to you by your health care provider. Make sure you discuss any questions you have with your health care provider. ° °

## 2014-05-04 NOTE — ED Notes (Signed)
Pt. 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  The next day my left shoulder was hurting going to my neck and down my arm.  Its 10 times worse when I lay down.  My chest started 2 days ago, and Im not sure if that's related to my shoulder, its on the same side (left) and its a continuous pain when I take a deep breath

## 2016-07-16 ENCOUNTER — Ambulatory Visit: Payer: Self-pay | Admitting: Family Medicine

## 2016-08-09 ENCOUNTER — Encounter: Payer: Self-pay | Admitting: Pediatrics

## 2016-08-09 ENCOUNTER — Ambulatory Visit (INDEPENDENT_AMBULATORY_CARE_PROVIDER_SITE_OTHER): Payer: 59 | Admitting: Pediatrics

## 2016-08-09 VITALS — BP 140/87 | HR 76 | Temp 97.6°F | Ht 63.0 in | Wt 329.0 lb

## 2016-08-09 DIAGNOSIS — Z23 Encounter for immunization: Secondary | ICD-10-CM

## 2016-08-09 DIAGNOSIS — Z720 Tobacco use: Secondary | ICD-10-CM

## 2016-08-09 DIAGNOSIS — F32A Depression, unspecified: Secondary | ICD-10-CM

## 2016-08-09 DIAGNOSIS — Z1331 Encounter for screening for depression: Secondary | ICD-10-CM

## 2016-08-09 DIAGNOSIS — I10 Essential (primary) hypertension: Secondary | ICD-10-CM | POA: Diagnosis not present

## 2016-08-09 DIAGNOSIS — F329 Major depressive disorder, single episode, unspecified: Secondary | ICD-10-CM | POA: Diagnosis not present

## 2016-08-09 DIAGNOSIS — Z1389 Encounter for screening for other disorder: Secondary | ICD-10-CM | POA: Diagnosis not present

## 2016-08-09 DIAGNOSIS — Z6841 Body Mass Index (BMI) 40.0 and over, adult: Secondary | ICD-10-CM | POA: Insufficient documentation

## 2016-08-09 LAB — BAYER DCA HB A1C WAIVED: HB A1C (BAYER DCA - WAIVED): 5.4 % (ref <7.0)

## 2016-08-09 MED ORDER — CHLORTHALIDONE 25 MG PO TABS: 25.0000 mg | ORAL_TABLET | Freq: Every day | ORAL | 3 refills | Status: DC

## 2016-08-09 MED ORDER — SERTRALINE HCL 50 MG PO TABS
50.0000 mg | ORAL_TABLET | Freq: Every day | ORAL | 3 refills | Status: DC
Start: 2016-08-09 — End: 2016-09-07

## 2016-08-09 MED ORDER — VARENICLINE TARTRATE 0.5 MG X 11 & 1 MG X 42 PO MISC: ORAL | 0 refills | Status: DC

## 2016-08-09 NOTE — Patient Instructions (Signed)
Any worsening of mood or night mares let me know  Start sertraline now Start chantix after taking sertraline for a week or more

## 2016-08-09 NOTE — Progress Notes (Signed)
Subjective:   Patient ID: Sierra Moreno, female    DOB: 1970/09/24, 46 y.o.   MRN: [id number] CC: New Patient (Initial Visit) (Smoking Cessation, Weight Loss,) and Back Pain (Lower, 1 year)  HPI: Sierra Moreno is a 46 y.o. female presenting for New Patient (Initial Visit) (Smoking Cessation, Weight Loss,) and Back Pain (Lower, 1 year)  Elevated BP: has never had before, not checked last few years Has not been to the doctor in several years, been without insurance  Smoking: daily Has tried nicotine patches, makes her skin irritated Has tried chantix in the past, caused vivid dreams, helped a lot with cravings  Mother passed away in 06/05/2010, was depressed some after that, feels in a slump ever since then Doesn't think the wellbutrin had her feeling any better Was tried on celexa, didn't help  Just started job at [employer], working nights now Tylenol PM helps her sleep wroks 12 h shifts 36 hrs a week Daughter is 31 yo, splits custody with father 50-50 Feels bad about her weight, not having energy to get up and do what she wants to Was planning to have weight loss surgery, surgery was cancelled  Says this is the highest weight she has ever been, was around 300 lbs at time of weight loss surgery scheduled  Drinks mostly diet soda, occasionally regular soda when eating out Is a nurse by training Doesn't like salads, vegetables Wants to start exercising, knees, feet hurting after being on feet  Depression screen PHQ 2/9 08/09/2016  Decreased Interest 2  Down, Depressed, Hopeless 1  PHQ - 2 Score 3  Altered sleeping 3  Tired, decreased energy 3  Change in appetite 2  Feeling bad or failure about yourself  3  Trouble concentrating 3  Moving slowly or fidgety/restless 0  Suicidal thoughts 0  PHQ-9 Score 17  Difficult doing work/chores Somewhat difficult     Past Medical History:  Diagnosis Date  . Miscarriage 06-05-1998  . Obesity   . Plantar fasciitis, bilateral    With heel spurs.  . Premature birth 2011   34 Weeks.   Family History  Problem Relation Age of Onset  . Diabetes Mother   . Congestive Heart Failure Mother   . COPD Father   . Cancer Maternal Aunt     Breast  . Cancer Maternal Aunt     Breast   Social History   Social History  . Marital status: Single    Spouse name: N/A  . Number of children: N/A  . Years of education: N/A   Social History Main Topics  . Smoking status: Current Every Day Smoker    Packs/day: 1.00    Types: Cigarettes  . Smokeless tobacco: Never Used     Comment: Patient states she knows she has to quit prior to sugery. Trying to quit. Has went from 1 pack/day to 0.50 pack or less daily.  . Alcohol use No  . Drug use: No  . Sexual activity: No   Other Topics Concern  . None   Social History Narrative  . None   ROS: All systems negative other than what is in HPI  Objective:    BP 140/87   Pulse 76   Temp 97.6 F (36.4 C) (Oral)   Ht 5' 3"  (1.6 m)   Wt (!) 329 lb (149.2 kg)   BMI 58.28 kg/m   Wt Readings from Last 3 Encounters:  08/09/16 (!) 329 lb (149.2 kg)  07/05/12 Marland Kitchen)  301 lb 11.2 oz (136.9 kg)  06/14/12 (!) 307 lb 6.4 oz (139.4 kg)    Gen: NAD, alert, cooperative with exam, NCAT EYES: EOMI, no conjunctival injection, or no icterus ENT:  OP without erythema CV: NRRR, normal S1/S2, no murmur, distal pulses 2+ b/l Resp: CTABL, no wheezes, normal WOB Abd: +BS, soft, obese, NTND.  Ext: No pitting edema, warm Neuro: Alert and oriented, strength equal b/l UE and LE, coordination grossly normal MSK: normal muscle bulk  Assessment & Plan:  [name] was seen today for new patient (initial visit) and back pain.  Diagnoses and all orders for this visit:  Essential hypertension Elevated, asymptomatic Start below Check at home -     CMP14+EGFR -     chlorthalidone (HYGROTON) 25 MG tablet; Take 1 tablet (25 mg total) by mouth daily.  Need for tetanus, diphtheria, and acellular pertussis  (Tdap) vaccine -     Tdap vaccine greater than or equal to 7yo IM  Depression screen positive  Depression, unspecified depression type Start below -     sertraline (ZOLOFT) 50 MG tablet; Take 1 tablet (50 mg total) by mouth daily.  Morbid obesity with BMI of 50.0-59.9, adult ([place]) Discussed lifestyle changes, decrease eating out, increase fruit/veg intake, avoid snack foods Will refer Tammy for nutrition/weight loss Check labs -     Lipid panel -     TSH -     CBC with Differential/Platelet -     Bayer DCA Hb A1c Waived -     VITAMIN D 25 Hydroxy (Vit-D Deficiency, Fractures)  Tobacco use Discussed cessation strategies Wants to try chantix again, was very successful at stopping while on it though did cause nigthmares Let me know if nightmares return -     varenicline (CHANTIX STARTING MONTH PAK) 0.5 MG X 11 & 1 MG X 42 tablet; Take one 0.5 mg tablet by mouth once daily for 3 days, then increase to one 0.5 mg tablet twice daily for 4 days, then increase to one 1 mg tablet twice daily.   Follow up plan: Return in about 4 weeks (around 09/06/2016). Assunta Found, MD [place]

## 2016-08-10 LAB — TSH: TSH: 1.65 u[IU]/mL (ref 0.450–4.500)

## 2016-08-10 LAB — CMP14+EGFR
A/G RATIO: 1.6 (ref 1.2–2.2)
ALT: 21 IU/L (ref 0–32)
AST: 22 IU/L (ref 0–40)
Albumin: 3.9 g/dL (ref 3.5–5.5)
Alkaline Phosphatase: 77 IU/L (ref 39–117)
BUN/Creatinine Ratio: 21 (ref 9–23)
BUN: 12 mg/dL (ref 6–24)
CALCIUM: 8.4 mg/dL — AB (ref 8.7–10.2)
CHLORIDE: 104 mmol/L (ref 96–106)
CO2: 20 mmol/L (ref 18–29)
Creatinine, Ser: 0.57 mg/dL (ref 0.57–1.00)
GFR calc Af Amer: 130 mL/min/{1.73_m2} (ref >59)
GFR, EST NON AFRICAN AMERICAN: 113 mL/min/{1.73_m2} (ref >59)
GLOBULIN, TOTAL: 2.5 g/dL (ref 1.5–4.5)
Glucose: 94 mg/dL (ref 65–99)
POTASSIUM: 4.3 mmol/L (ref 3.5–5.2)
Sodium: 139 mmol/L (ref 134–144)
Total Protein: 6.4 g/dL (ref 6.0–8.5)

## 2016-08-10 LAB — CBC WITH DIFFERENTIAL/PLATELET
Basophils Absolute: 0.1 10*3/uL (ref 0.0–0.2)
Basos: 1 %
EOS (ABSOLUTE): 0.2 10*3/uL (ref 0.0–0.4)
EOS: 4 %
Hematocrit: 41.9 % (ref 34.0–46.6)
Hemoglobin: 13.6 g/dL (ref 11.1–15.9)
IMMATURE GRANULOCYTES: 0 %
Immature Grans (Abs): 0 10*3/uL (ref 0.0–0.1)
LYMPHS ABS: 2.3 10*3/uL (ref 0.7–3.1)
Lymphs: 36 %
MCH: 29.2 pg (ref 26.6–33.0)
MCHC: 32.5 g/dL (ref 31.5–35.7)
MCV: 90 fL (ref 79–97)
Monocytes Absolute: 0.5 10*3/uL (ref 0.1–0.9)
Monocytes: 9 %
NEUTROS ABS: 3.2 10*3/uL (ref 1.4–7.0)
Neutrophils: 50 %
Platelets: 378 10*3/uL (ref 150–379)
RBC: 4.65 x10E6/uL (ref 3.77–5.28)
RDW: 14 % (ref 12.3–15.4)
WBC: 6.4 10*3/uL (ref 3.4–10.8)

## 2016-08-10 LAB — LIPID PANEL
CHOL/HDL RATIO: 3.4 ratio (ref 0.0–4.4)
CHOLESTEROL TOTAL: 169 mg/dL (ref 100–199)
HDL: 50 mg/dL (ref >39)
LDL Calculated: 104 mg/dL — ABNORMAL HIGH (ref 0–99)
Triglycerides: 77 mg/dL (ref 0–149)
VLDL Cholesterol Cal: 15 mg/dL (ref 5–40)

## 2016-08-10 LAB — VITAMIN D 25 HYDROXY (VIT D DEFICIENCY, FRACTURES): Vit D, 25-Hydroxy: 15.1 ng/mL — ABNORMAL LOW (ref 30.0–100.0)

## 2016-08-11 ENCOUNTER — Other Ambulatory Visit: Payer: Self-pay | Admitting: Pediatrics

## 2016-08-11 MED ORDER — VITAMIN D (ERGOCALCIFEROL) 1.25 MG (50000 UNIT) PO CAPS: 50000.0000 [IU] | ORAL_CAPSULE | ORAL | 0 refills | Status: DC

## 2016-08-18 ENCOUNTER — Telehealth: Payer: Self-pay

## 2016-08-20 NOTE — Telephone Encounter (Signed)
Insurance denied chantix, can you call and ask pt if she would be willing to try wellbutrin/buproprion first? If so OK to send in #60 tabs buproprion 150mg  with 3 refills. Take 1 tab daily for three days then take BID.

## 2016-08-23 ENCOUNTER — Ambulatory Visit: Payer: 59 | Admitting: Pharmacist

## 2016-08-26 NOTE — Telephone Encounter (Signed)
Left message for patient to return our call about a medication that was not covered by her insurance.

## 2016-09-07 ENCOUNTER — Ambulatory Visit (INDEPENDENT_AMBULATORY_CARE_PROVIDER_SITE_OTHER): Payer: 59 | Admitting: Pediatrics

## 2016-09-07 ENCOUNTER — Encounter: Payer: Self-pay | Admitting: Pediatrics

## 2016-09-07 VITALS — BP 105/74 | HR 77 | Temp 97.7°F | Ht 63.0 in | Wt 321.0 lb

## 2016-09-07 DIAGNOSIS — F329 Major depressive disorder, single episode, unspecified: Secondary | ICD-10-CM | POA: Diagnosis not present

## 2016-09-07 DIAGNOSIS — E559 Vitamin D deficiency, unspecified: Secondary | ICD-10-CM | POA: Insufficient documentation

## 2016-09-07 DIAGNOSIS — Z720 Tobacco use: Secondary | ICD-10-CM | POA: Diagnosis not present

## 2016-09-07 DIAGNOSIS — I10 Essential (primary) hypertension: Secondary | ICD-10-CM

## 2016-09-07 DIAGNOSIS — F32A Depression, unspecified: Secondary | ICD-10-CM | POA: Insufficient documentation

## 2016-09-07 MED ORDER — SERTRALINE HCL 50 MG PO TABS: 100.0000 mg | ORAL_TABLET | Freq: Every day | ORAL | 2 refills | Status: DC

## 2016-09-07 NOTE — Patient Instructions (Addendum)
[email or web address]  Your provider wants you to schedule an appointment with a Psychologist/Psychiatrist. The following list of offices requires the patient to call and make their own appointment, as there is information they need that only you can provide. Please feel free to choose form the following providers:  [facility]   [phone] Crisis Recovery in [place] [phone]  [facility]  [phone]   [address] [place], [place]  (Scheduled through [facility]) Must call and do an interview for appointment. Sees Children / Accepts Medicaid  [facility]    [phone]  [address], [address]    [place], [place]       [facility]  [phone] [address] [place], [place]  Evaluates for Autism but does not treat it Sees Children / Accepts Medicaid  [facility]    [phone] [address]   [place], [place] Medication management, substance abuse, bipolar, grief, family, marriage, OCD, anxiety, PTSD Sees children / Accepts Medicaid  [facility]    [phone] [address] [place], [place] Sees children / Accepts Medicaid  [facility]  [phone] [address] [place], [place]   Dr Akinlayo     [phone] [address] [place], [place]  Sees ADD & ADHD for treatment Accepts Medicaid  [facility]  [phone] [address] [place], [place] Evaluates for Autism Accepts Medicaid  [facility]   [phone] [address]   [place], [place] Uses animal therapy  Sees children as young as 3 years old Accepts Medicaid  [facility]     [phone]    [address]  [place], [place] [address] Sees children Accepts Medicaid   

## 2016-09-07 NOTE — Progress Notes (Signed)
  Subjective:   Patient ID: Sierra Moreno, female    DOB: Aug 12, 1970, 46 y.o.   MRN: [id number] CC: Follow-up (depression, htn, and smoking cessation)  HPI: Sierra Moreno is a 46 y.o. female presenting for Follow-up (depression, htn, and smoking cessation)  Here today with her aunt  Depression: doesn't want to do anything, stay in bed all the time Has not wanted to get up and do anything Taking zoloft '50mg'$  for 4 weeks No thoughts of self harm  HTN: taking chlorthalidone at bedtime No CP, no lightheadedness or dizziness Swelling improved  Vit D def: took 4 weeks of vit D replacement, due for another 4 weeks   Tobacco use: ongoing, insurance didn't cover chantix Has tried wellbutrin in the past for depression  Relevant past medical, surgical, family and social history reviewed. Allergies and medications reviewed and updated. History  Smoking Status  . Current Every Day Smoker  . Packs/day: 1.00  . Types: Cigarettes  Smokeless Tobacco  . Never Used       ROS: Per HPI   Objective:    BP 105/74   Pulse 77   Temp 97.7 F (36.5 C) (Oral)   Ht '5\' 3"'$  (1.6 m)   Wt (!) 321 lb (145.6 kg)   BMI 56.86 kg/m   Wt Readings from Last 3 Encounters:  09/07/16 (!) 321 lb (145.6 kg)  08/09/16 (!) 329 lb (149.2 kg)  07/05/12 (!) 301 lb 11.2 oz (136.9 kg)    Gen: NAD, alert, cooperative with exam, NCAT EYES: EOMI, no conjunctival injection, or no icterus ENT:  OP without erythema LYMPH: no cervical LAD CV: NRRR, normal S1/S2 Resp: CTABL, no wheezes, normal WOB Ext: No edema, warm Neuro: Alert and oriented, strength equal b/l UE and LE Psych: normal affect, no thoughts of self harm  Assessment & Plan:  Sierra Moreno was seen today for follow-up med problems  Diagnoses and all orders for this visit:  Essential hypertension Improved, now well controlled, cont chlorthalidone Recheck labs after starting new med -     BMP8+EGFR  Depression, unspecified depression type Ongoing  symptoms, increase to '100mg'$  daily Feels safe at home, no thoughts of self harm Let me know if ongoing symptoms in two weeks, will increase to '150mg'$  Gave list of counselors in area Pt to call to set up appt -     sertraline (ZOLOFT) 50 MG tablet; Take 2 tablets (100 mg total) by mouth daily.  Vitamin D Cont 4 more weeks of high dose, then daily  Elevated BMI Continue lifestyle changes, weight down  Follow up plan: Return in about 8 weeks (around 11/02/2016). Assunta Found, MD [place]

## 2016-09-08 LAB — BMP8+EGFR
BUN/Creatinine Ratio: 18 (ref 9–23)
BUN: 14 mg/dL (ref 6–24)
CALCIUM: 8.9 mg/dL (ref 8.7–10.2)
CHLORIDE: 100 mmol/L (ref 96–106)
CO2: 28 mmol/L (ref 18–29)
Creatinine, Ser: 0.77 mg/dL (ref 0.57–1.00)
GFR calc Af Amer: 108 mL/min/{1.73_m2} (ref >59)
GFR, EST NON AFRICAN AMERICAN: 94 mL/min/{1.73_m2} (ref >59)
GLUCOSE: 94 mg/dL (ref 65–99)
Potassium: 5.2 mmol/L (ref 3.5–5.2)
SODIUM: 140 mmol/L (ref 134–144)

## 2017-02-08 ENCOUNTER — Encounter: Payer: Self-pay | Admitting: Pediatrics

## 2017-02-08 ENCOUNTER — Other Ambulatory Visit (HOSPITAL_COMMUNITY): Payer: Self-pay | Admitting: Surgery

## 2017-02-08 DIAGNOSIS — I10 Essential (primary) hypertension: Secondary | ICD-10-CM

## 2017-02-11 MED ORDER — CHLORTHALIDONE 25 MG PO TABS: 25.0000 mg | ORAL_TABLET | Freq: Every day | ORAL | 3 refills | Status: DC

## 2017-02-11 NOTE — Telephone Encounter (Signed)
Pt needs to be seen for chantix, I sent in bp med

## 2017-02-16 ENCOUNTER — Other Ambulatory Visit: Payer: Self-pay

## 2017-02-16 ENCOUNTER — Ambulatory Visit (HOSPITAL_COMMUNITY)
Admission: RE | Admit: 2017-02-16 | Discharge: 2017-02-16 | Disposition: A | Discharge status: Home / Self Care | Payer: BLUE CROSS/BLUE SHIELD | Source: Ambulatory Visit | Attending: Surgery | Admitting: Surgery

## 2017-02-16 DIAGNOSIS — K30 Functional dyspepsia: Secondary | ICD-10-CM | POA: Diagnosis not present

## 2017-02-16 DIAGNOSIS — Z01818 Encounter for other preprocedural examination: Secondary | ICD-10-CM | POA: Insufficient documentation

## 2017-02-16 DIAGNOSIS — Z6841 Body Mass Index (BMI) 40.0 and over, adult: Secondary | ICD-10-CM | POA: Insufficient documentation

## 2017-02-18 ENCOUNTER — Encounter: Payer: BLUE CROSS/BLUE SHIELD | Attending: Surgery | Admitting: Registered"

## 2017-02-18 ENCOUNTER — Encounter: Payer: Self-pay | Admitting: Registered"

## 2017-02-18 DIAGNOSIS — E669 Obesity, unspecified: Secondary | ICD-10-CM

## 2017-02-18 DIAGNOSIS — Z6841 Body Mass Index (BMI) 40.0 and over, adult: Secondary | ICD-10-CM | POA: Diagnosis not present

## 2017-02-18 NOTE — Progress Notes (Signed)
Pre-Op Assessment Visit:  Pre-Operative RYGB Surgery  Medical Nutrition Therapy:  Appt start time: 9:30  End time:  10:42  Patient was seen on 02/18/2017 for Pre-Operative Nutrition Assessment. Assessment and [facility] coordinator on 02/18/2017.   Pt expectation of surgery: move around more, to ride bikes/be more active with daughter, increase energy, improve ability to do ADLs, improve joint pain; lose 100 lbs  Pt expectation of Dietitian: provide recommendations for what to eat now to get on the right track for post-op  Start weight at [facility]: 322.7 BMI: 61.99   Pt is talkative. Pt states she is currently quitting smoking, but its rough. Pt states stressful situations can lead her to smoke. 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 Pt states she wears the patch sometimes because it irritates her skin really bad. Pt states her knees hurt when walking sometimes. Pt states she eats out a lot and works a variety of shifts, normally 7a-7p. Pt states she does not feel comfortable in her body; reports poor self-image. Pt states she and child's father share 50/50 custody. Pt states she eats out a lot when 45 year old daughter is not with her and they often eat at [employer] because daughter likes it.   Pt is unsure of how many visits she needs prior to surgery; will contact [facility] to verify.     24 hr Dietary Recall: First Meal: skips Snack: none Second Meal: [employer]-2 chicken legs, biscuit Snack: vending machine (chips, candy bar) Third Meal: sometimes cookie, chips or leftovers Snack: none Beverages: Mtn Dew, water with crystal light, sweet tea (sometimes)   Encouraged to engage in 75 minutes of moderate physical activity including cardiovascular and weight baring weekly  Handouts given during visit include:  . Pre-Op Goals . Bariatric Surgery Protein Shakes . Vitamin and Mineral Recommendations  During the  appointment today the following Pre-Op Goals were reviewed with the patient: . Maintain or lose weight as instructed by your surgeon . Make healthy food choices . Begin to limit portion sizes . Limited concentrated sugars and fried foods . Keep fat/sugar in the single digits per serving on          food labels . Practice CHEWING your food  (aim for 30 chews per bite or until applesauce consistency) . Practice not drinking 15 minutes before, during, and 30 minutes after each meal/snack . Avoid all carbonated beverages  . Avoid/limit caffeinated beverages  . Avoid all sugar-sweetened beverages . Consume 3 meals per day; eat every 3-5 hours . Make a list of non-food related activities . Aim for 64-100 ounces of FLUID daily  . Aim for at least 60-80 grams of PROTEIN daily . Look for a liquid protein source that contain ?15 g protein and ?5 g carbohydrate  (ex: shakes, drinks, shots) . Physical activity is an important part of a healthy lifestyle so keep it moving!  Follow diet recommendations listed below Energy and Macronutrient Recommendations: Calories: 1800 Carbohydrate: 200 Protein: 135 Fat: 50  Demonstrated degree of understanding via:  Teach Back   Teaching Method Utilized:  Visual Auditory Hands on  Barriers to learning/adherence to lifestyle change: work-life balance   Patient to call the [facility] to enroll in Pre-Op and Post-Op Nutrition Education when surgery date is scheduled.

## 2017-02-18 NOTE — Patient Instructions (Signed)
-   Contact [facility] to find out how many (if any) supervised weight loss visits are needed with us prior to surgery.

## 2017-03-14 ENCOUNTER — Ambulatory Visit: Payer: BLUE CROSS/BLUE SHIELD

## 2017-04-11 ENCOUNTER — Ambulatory Visit: Payer: BLUE CROSS/BLUE SHIELD | Admitting: Skilled Nursing Facility1

## 2017-04-25 ENCOUNTER — Encounter: Payer: BLUE CROSS/BLUE SHIELD | Attending: Surgery | Admitting: Skilled Nursing Facility1

## 2017-04-25 DIAGNOSIS — Z6841 Body Mass Index (BMI) 40.0 and over, adult: Secondary | ICD-10-CM | POA: Insufficient documentation

## 2017-04-27 ENCOUNTER — Encounter: Payer: Self-pay | Admitting: Skilled Nursing Facility1

## 2017-04-27 NOTE — Progress Notes (Signed)
Pre-Operative Nutrition Class:  Appt start time: 0388   End time:  1830.  Patient was seen on 04/27/2017 for Pre-Operative Bariatric Surgery Education at [facility].   Surgery date:  Surgery type: RYGB Start weight at [facility]: 322.7 Weight today: 320.1  Samples given per MNT protocol. Patient educated on appropriate usage: Bariatric Advantage Multivitamin Lot # [id number] Exp: 07/19  Bariatric Advantage Calcium  Lot # [id number] Exp: Dec 11 2017  Unjury Protein Powder   Lot # [id number] Exp: 03/2019  The following the learning objectives were met by the patient during this course:  Identify Pre-Op Dietary Goals and will begin 2 weeks pre-operatively  Identify appropriate sources of fluids and proteins   State protein recommendations and appropriate sources pre and post-operatively  Identify Post-Operative Dietary Goals and will follow for 2 weeks post-operatively  Identify appropriate multivitamin and calcium sources  Describe the need for physical activity post-operatively and will follow MD recommendations  State when to call healthcare provider regarding medication questions or post-operative complications  Handouts given during class include:  Pre-Op Bariatric Surgery Diet Handout  Protein Shake Handout  Post-Op Bariatric Surgery Nutrition Handout  [facility] Program Information Flyer  Support Group Information Flyer  [facility] Bariatric Supplements Price List  Follow-Up Plan: Patient will follow-up at [facility] 2 weeks post operatively for diet advancement per MD.

## 2017-05-13 ENCOUNTER — Ambulatory Visit: Payer: Self-pay | Admitting: Surgery

## 2017-05-13 NOTE — H&P (Signed)
Surgical H&P  CC: morbid obesity  HPI: Here for preop visit. Negative urine nicotine/cotinine on 1/30. Has completed the preop pathway. Deemed appropriate by psych ad nutrition. Labs, UGI and CXR unremarkable.   She has cut out soda!   initial visit 01/27/17: Very pleasant 47 year old woman who presents today to discuss weight loss surgery. She is interested in gastric bypass. She actually went through the entire process was set up for surgery with Dr. Ezzard StandingNewman in 2014, but her surgery was canceled as she was not compliant with her preoperative diet. She has been dealing with weight her entire life. Her entire mother's side of the family is overweight. She has a family history of obesity, hypertension, heart disease etc. She herself has mild hypertension but no other comorbidities, denies reflux. No known diabetes. She has tried many things to lose weight. Her most successful attempt was early 6120s when she was on Fen/Phen and lost about 100 pounds. She slowly gaining this back, and then went through 2 miscarriages followed by a third pregnancy was delivered at 7 months, and through each of these gaining horn more weight. She is currently at her highest adult weight. She experiences general fatigue and is generally unable to participate in the activities that she would like to do with her daughter. She does admit that her diet at this time is not well balanced or healthy. 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 No exercise. On her days off the time that she enjoys spinning with her daughters usually spent going on for a meal and there is no real limitation or consideration balancing her dietary intake. She admits that she can drink about 2 L of soda in a day for example. She states that she quit smoking 2 months ago.   plantar fasciitis obesity 1ppd smoker (quit) vit d deficiency- replaced by pcp depression-  zoloft htn-chlorthalidone.  Allergies  Allergen Reactions  . Lamisil [Terbinafine] Hives    Past Medical History:  Diagnosis Date  . Hypertension   . Miscarriage 2000,2001  . Obesity   . Plantar fasciitis, bilateral    With heel spurs.  . Premature birth 2011   34 Weeks.    Past Surgical History:  Procedure Laterality Date  . BREATH TEK H PYLORI N/A 06/28/2012   Procedure: BREATH TEK H PYLORI;  Surgeon: Kandis Cockingavid H Newman, MD;  [facility];  Service: General;  Laterality: N/A;    Family History  Problem Relation Age of Onset  . Diabetes Mother   . Congestive Heart Failure Mother   . COPD Father   . Cancer Maternal Aunt        Breast  . Cancer Maternal Aunt        Breast  . Hypertension Other     Social History   Socioeconomic History  . Marital status: Single    Spouse name: Not on file  . Number of children: Not on file  . Years of education: Not on file  . Highest education level: Not on file  Social Needs  . Financial resource strain: Not on file  . Food insecurity - worry: Never true  . Food insecurity - inability: Never true  . Transportation needs - medical: Not on file  . Transportation needs - non-medical: Not on file  Occupational History  . Not on file  Tobacco Use  . Smoking status: Current Every Day Smoker    Packs/day: 1.00  Types: Cigarettes  . Smokeless tobacco: Never Used  . Tobacco comment: Patient states she knows she has to quit prior to sugery. Trying to quit. Has went from 1 pack/day to 0.50 pack or less daily.  Substance and Sexual Activity  . Alcohol use: No  . Drug use: No  . Sexual activity: No  Other Topics Concern  . Not on file  Social History Narrative  . Not on file    Current Outpatient Medications on File Prior to Visit  Medication Sig Dispense Refill  . chlorthalidone (HYGROTON) 25 MG tablet Take 1 tablet (25 mg total) daily by mouth. 30 tablet 3  . ibuprofen (ADVIL,MOTRIN) 200 MG tablet Take 200 mg by  mouth every 6 (six) hours as needed.    . Misc Natural Products (OSTEO BI-FLEX JOINT SHIELD PO) Take by mouth.    . Multiple Vitamins-Minerals (MULTIVITAMIN WITH MINERALS) tablet Take 1 tablet by mouth daily.    . sertraline (ZOLOFT) 50 MG tablet Take 2 tablets (100 mg total) by mouth daily. (Patient not taking: Reported on 02/18/2017) 60 tablet 2  . Vitamin D, Ergocalciferol, (DRISDOL) 50000 units CAPS capsule Take 1 capsule (50,000 Units total) by mouth every 7 (seven) days. (Patient not taking: Reported on 02/18/2017) 8 capsule 0   No current facility-administered medications on file prior to visit.     Review of Systems: a complete, 10pt review of systems was completed with pertinent positives and negatives as documented in the HPI  Physical Exam: There were no vitals filed for this visit. Gen: A&Ox3, no distress  Head: normocephalic, atraumatic Eyes: extraocular motions intact, anicteric.  Neck: supple without mass or thyromegaly Chest: unlabored respirations, symmetrical air entry, clear bilaterally   Cardiovascular: RRR with palpable distal pulses, no pedal edema Abdomen: obese, soft, nondistended, nontender. No mass or organomegaly.  Extremities: warm, without edema, no deformities  Neuro: grossly intact Psych: appropriate mood and affect, normal insight  Skin: warm and dry   CBC Latest Ref Rng & Units 08/09/2016 06/14/2012 09/12/2009  WBC 3.4 - 10.8 x10E3/uL 6.4 6.6 19.8(H)  Hemoglobin 11.1 - 15.9 g/dL 78.2 95.6 9.9(L)  Hematocrit 34.0 - 46.6 % 41.9 42.8 28.2(L)  Platelets 150 - 379 x10E3/uL 378 391 328    CMP Latest Ref Rng & Units 09/07/2016 08/09/2016 06/28/2012  Glucose 65 - 99 mg/dL 94 94 93  BUN 6 - 24 mg/dL 14 12 15   Creatinine 0.57 - 1.00 mg/dL 2.13 0.86 5.78  Sodium 134 - 144 mmol/L 140 139 137  Potassium 3.5 - 5.2 mmol/L 5.2 4.3 4.6  Chloride 96 - 106 mmol/L 100 104 106  CO2 18 - 29 mmol/L 28 20 22   Calcium 8.7 - 10.2 mg/dL 8.9 4.6(N) 8.7  Total Protein 6.0 - 8.5  g/dL - 6.4 6.8  Total Bilirubin 0.0 - 1.2 mg/dL - <6.2 0.3  Alkaline Phos 39 - 117 IU/L - 77 63  AST 0 - 40 IU/L - 22 24  ALT 0 - 32 IU/L - 21 20    No results found for: INR, PROTIME  Imaging: No results found.   A/P: To OR for roux en y gastric bypass. The risks of the surgery have been discussed at length with the patient, infection, scarring, hernia, anastomotic leak, internal hernia, ulcer, injury to intra-abdominal structures, chronic abdominal pain and chronic nausea.  Questions have been welcomed and answered.   Phylliss Blakes, MD [facility], [place] Pager [phone]

## 2017-09-19 ENCOUNTER — Telehealth: Payer: Self-pay | Admitting: Skilled Nursing Facility1

## 2017-09-19 NOTE — Telephone Encounter (Signed)
Dietitian called the pt seeing if she had any questions before surgery.   Pt states she is ready and prepared.

## 2017-09-22 NOTE — Progress Notes (Signed)
Please place orders in epic for a preop. Thank you

## 2017-10-17 NOTE — Progress Notes (Signed)
ekg , cxr 02-16-17 epic

## 2017-10-17 NOTE — Patient Instructions (Addendum)
Sierra Moreno  10/17/2017   Your procedure is scheduled on: 10-24-17    Report to [facility] Main  Entrance    Report to admitting at 5:30AM    Call this number if you have problems the morning of surgery [phone]     Remember: NO SOLID FOOD AFTER 600 PM THE NIGHT BEFORE YOUR SURGERY. YOU MAY DRINK CLEAR FLUIDS. MORNING OF SURGERY DRINK:  1SHAKE BEFORE YOU LEAVE HOME, DRINK ALL OF THE SHAKE AT ONE TIME. THE SHAKE YOU DRINK BEFORE YOU LEAVE HOME WILL BE THE LAST FLUIDS YOU DRINK BEFORE SURGERY.    CLEAR LIQUID DIET   Foods Allowed                                                                     Foods Excluded  Coffee and tea, regular and decaf                             liquids that you cannot  Plain Jell-O in any flavor                                             see through such as: Fruit ices (not with fruit pulp)                                     milk, soups, orange juice  Iced Popsicles                                    All solid food Carbonated beverages, regular and diet                                    Cranberry, grape and apple juices Sports drinks like Gatorade Lightly seasoned clear broth or consume(fat free) Sugar, honey syrup  Sample Menu Breakfast                                Lunch                                     Supper Cranberry juice                    Beef broth                            Chicken broth Jell-O                                     Grape juice  Apple juice Coffee or tea                        Jell-O                                      Popsicle                                                Coffee or tea                        Coffee or tea   PAIN IS EXPECTED AFTER SURGERY AND WILL NOT BE COMPLETELY ELIMINATED. AMBULATION AND TYLENOL WILL HELP REDUCE INCISIONAL AND GAS PAIN. MOVEMENT IS KEY!  YOU ARE EXPECTED TO BE OUT OF BED WITHIN 4 HOURS OF ADMISSION TO YOUR PATIENT  ROOM.  SITTING IN THE RECLINER THROUGHOUT THE DAY IS IMPORTANT FOR DRINKING FLUIDS AND MOVING GAS THROUGHOUT THE GI TRACT.  COMPRESSION STOCKINGS SHOULD BE WORN [facility] STAY UNLESS YOU ARE WALKING.   INCENTIVE SPIROMETER SHOULD BE USED EVERY HOUR WHILE AWAKE TO DECREASE POST-OPERATIVE COMPLICATIONS SUCH AS PNEUMONIA.  WHEN DISCHARGED HOME, IT IS IMPORTANT TO CONTINUE TO WALK EVERY HOUR AND USE THE INCENTIVE SPIROMETER EVERY HOUR.     _____________________________________________________________________       Take these medicines the morning of surgery with A SIP OF WATER: TYLENOL                                 You may not have any metal on your body including hair pins and              piercings  Do not wear jewelry, make-up, lotions, powders or perfumes, deodorant             Do not wear nail polish.  Do not shave  48 hours prior to surgery.         Do not bring valuables to the hospital. [facility] IS NOT             RESPONSIBLE   FOR VALUABLES.  Contacts, dentures or bridgework may not be worn into surgery.  Leave suitcase in the car. After surgery it may be brought to your room.                  Please read over the following fact sheets you were given: _____________________________________________________________________             [facility] - Preparing for Surgery Before surgery, you can play an important role.  Because skin is not sterile, your skin needs to be as free of germs as possible.  You can reduce the number of germs on your skin by washing with CHG (chlorahexidine gluconate) soap before surgery.  CHG is an antiseptic cleaner which kills germs and bonds with the skin to continue killing germs even after washing. Please DO NOT use if you have an allergy to CHG or antibacterial soaps.  If your skin becomes reddened/irritated stop using the CHG and inform your nurse when you arrive at Short Stay. Do not shave (including legs and underarms)  for at least  48 hours prior to the first CHG shower.  You may shave your face/neck. Please follow these instructions carefully:  1.  Shower with CHG Soap the night before surgery and the  morning of Surgery.  2.  If you choose to wash your hair, wash your hair first as usual with your  normal  shampoo.  3.  After you shampoo, rinse your hair and body thoroughly to remove the  shampoo.                           4.  Use CHG as you would any other liquid soap.  You can apply chg directly  to the skin and wash                       Gently with a scrungie or clean washcloth.  5.  Apply the CHG Soap to your body ONLY FROM THE NECK DOWN.   Do not use on face/ open                           Wound or open sores. Avoid contact with eyes, ears mouth and genitals (private parts).                       Wash face,  Genitals (private parts) with your normal soap.             6.  Wash thoroughly, paying special attention to the area where your surgery  will be performed.  7.  Thoroughly rinse your body with warm water from the neck down.  8.  DO NOT shower/wash with your normal soap after using and rinsing off  the CHG Soap.                9.  Pat yourself dry with a clean towel.            10.  Wear clean pajamas.            11.  Place clean sheets on your bed the night of your first shower and do not  sleep with pets. Day of Surgery : Do not apply any lotions/deodorants the morning of surgery.  Please wear clean clothes to the hospital/surgery center.  FAILURE TO FOLLOW THESE INSTRUCTIONS MAY RESULT IN THE CANCELLATION OF YOUR SURGERY PATIENT SIGNATURE_________________________________  NURSE SIGNATURE__________________________________  ________________________________________________________________________   Sierra Moreno  An incentive spirometer is a tool that can help keep your lungs clear and active. This tool measures how well you are filling your lungs with each breath. Taking long deep  breaths may help reverse or decrease the chance of developing breathing (pulmonary) problems (especially infection) following:  A long period of time when you are unable to move or be active. BEFORE THE PROCEDURE   If the spirometer includes an indicator to show your best effort, your nurse or respiratory therapist will set it to a desired goal.  If possible, sit up straight or lean slightly forward. Try not to slouch.  Hold the incentive spirometer in an upright position. INSTRUCTIONS FOR USE  1. Sit on the edge of your bed if possible, or sit up as far as you can in bed or on a chair. 2. Hold the incentive spirometer in an upright position. 3. Breathe out normally. 4. Place the mouthpiece in your mouth and seal your lips tightly around it. 5.  Breathe in slowly and as deeply as possible, raising the piston or the ball toward the top of the column. 6. Hold your breath for 3-5 seconds or for as long as possible. Allow the piston or ball to fall to the bottom of the column. 7. Remove the mouthpiece from your mouth and breathe out normally. 8. Rest for a few seconds and repeat Steps 1 through 7 at least 10 times every 1-2 hours when you are awake. Take your time and take a few normal breaths between deep breaths. 9. The spirometer may include an indicator to show your best effort. Use the indicator as a goal to work toward during each repetition. 10. After each set of 10 deep breaths, practice coughing to be sure your lungs are clear. If you have an incision (the cut made at the time of surgery), support your incision when coughing by placing a pillow or rolled up towels firmly against it. Once you are able to get out of bed, walk around indoors and cough well. You may stop using the incentive spirometer when instructed by your caregiver.  RISKS AND COMPLICATIONS  Take your time so you do not get dizzy or light-headed.  If you are in pain, you may need to take or ask for pain medication before  doing incentive spirometry. It is harder to take a deep breath if you are having pain. AFTER USE  Rest and breathe slowly and easily.  It can be helpful to keep track of a log of your progress. Your caregiver can provide you with a simple table to help with this. If you are using the spirometer at home, follow these instructions: SEEK MEDICAL CARE IF:   You are having difficultly using the spirometer.  You have trouble using the spirometer as often as instructed.  Your pain medication is not giving enough relief while using the spirometer.  You develop fever of 100.5 F (38.1 C) or higher. SEEK IMMEDIATE MEDICAL CARE IF:   You cough up bloody sputum that had not been present before.  You develop fever of 102 F (38.9 C) or greater.  You develop worsening pain at or near the incision site. MAKE SURE YOU:   Understand these instructions.  Will watch your condition.  Will get help right away if you are not doing well or get worse. Document Released: 08/02/2006 Document Revised: 06/14/2011 Document Reviewed: 10/03/2006 [facility] Patient Information 2014 [place], [place].   ________________________________________________________________________

## 2017-10-18 ENCOUNTER — Ambulatory Visit: Payer: Self-pay | Admitting: Surgery

## 2017-10-19 ENCOUNTER — Encounter (HOSPITAL_COMMUNITY)
Admission: RE | Admit: 2017-10-19 | Discharge: 2017-10-19 | Disposition: A | Discharge status: Home / Self Care | Payer: BLUE CROSS/BLUE SHIELD | Source: Ambulatory Visit | Attending: Surgery | Admitting: Surgery

## 2017-10-19 ENCOUNTER — Encounter (HOSPITAL_COMMUNITY): Payer: Self-pay

## 2017-10-19 ENCOUNTER — Other Ambulatory Visit: Payer: Self-pay

## 2017-10-19 DIAGNOSIS — Z0183 Encounter for blood typing: Secondary | ICD-10-CM | POA: Diagnosis not present

## 2017-10-19 DIAGNOSIS — Z6841 Body Mass Index (BMI) 40.0 and over, adult: Secondary | ICD-10-CM | POA: Diagnosis not present

## 2017-10-19 DIAGNOSIS — Z01812 Encounter for preprocedural laboratory examination: Secondary | ICD-10-CM | POA: Insufficient documentation

## 2017-10-19 HISTORY — DX: Bitten or stung by nonvenomous insect and other nonvenomous arthropods, initial encounter: W57.XXXA

## 2017-10-19 LAB — COMPREHENSIVE METABOLIC PANEL
ALBUMIN: 3.6 g/dL (ref 3.5–5.0)
ALT: 21 U/L (ref 0–44)
ANION GAP: 8 (ref 5–15)
AST: 25 U/L (ref 15–41)
Alkaline Phosphatase: 68 U/L (ref 38–126)
BILIRUBIN TOTAL: 0.2 mg/dL — AB (ref 0.3–1.2)
BUN: 21 mg/dL — AB (ref 6–20)
CHLORIDE: 106 mmol/L (ref 98–111)
CO2: 24 mmol/L (ref 22–32)
Calcium: 8.4 mg/dL — ABNORMAL LOW (ref 8.9–10.3)
Creatinine, Ser: 0.66 mg/dL (ref 0.44–1.00)
GFR calc Af Amer: >60 mL/min (ref >60)
GLUCOSE: 98 mg/dL (ref 70–99)
POTASSIUM: 4 mmol/L (ref 3.5–5.1)
Sodium: 138 mmol/L (ref 135–145)
TOTAL PROTEIN: 7.1 g/dL (ref 6.5–8.1)

## 2017-10-19 LAB — CBC WITH DIFFERENTIAL/PLATELET
BASOS ABS: 0 10*3/uL (ref 0.0–0.1)
BASOS PCT: 1 %
EOS PCT: 2 %
Eosinophils Absolute: 0.2 10*3/uL (ref 0.0–0.7)
HEMATOCRIT: 44.3 % (ref 36.0–46.0)
Hemoglobin: 15.1 g/dL — ABNORMAL HIGH (ref 12.0–15.0)
Lymphocytes Relative: 24 %
Lymphs Abs: 1.9 10*3/uL (ref 0.7–4.0)
MCH: 31.2 pg (ref 26.0–34.0)
MCHC: 34.1 g/dL (ref 30.0–36.0)
MCV: 91.5 fL (ref 78.0–100.0)
MONO ABS: 0.8 10*3/uL (ref 0.1–1.0)
MONOS PCT: 9 %
Neutro Abs: 5.2 10*3/uL (ref 1.7–7.7)
Neutrophils Relative %: 64 %
PLATELETS: 394 10*3/uL (ref 150–400)
RBC: 4.84 MIL/uL (ref 3.87–5.11)
RDW: 13.4 % (ref 11.5–15.5)
WBC: 8 10*3/uL (ref 4.0–10.5)

## 2017-10-20 LAB — ABO/RH: ABO/RH(D): A POS

## 2017-10-23 MED ORDER — BUPIVACAINE LIPOSOME 1.3 % IJ SUSP
20.0000 mL | Freq: Once | INTRAMUSCULAR | Status: DC
  Filled 2017-10-23: qty 20

## 2017-10-24 ENCOUNTER — Encounter (HOSPITAL_COMMUNITY)
Admission: RE | Disposition: A | Discharge status: Home / Self Care | Payer: Self-pay | Source: Home / Self Care | Attending: Surgery

## 2017-10-24 ENCOUNTER — Other Ambulatory Visit: Payer: Self-pay

## 2017-10-24 ENCOUNTER — Inpatient Hospital Stay (HOSPITAL_COMMUNITY)
Admission: RE | Admit: 2017-10-24 | Discharge: 2017-10-25 | DRG: 621 | Disposition: A | Discharge status: Home / Self Care | Payer: BLUE CROSS/BLUE SHIELD | Attending: Surgery | Admitting: Surgery

## 2017-10-24 ENCOUNTER — Inpatient Hospital Stay (HOSPITAL_COMMUNITY): Payer: BLUE CROSS/BLUE SHIELD | Admitting: Certified Registered Nurse Anesthetist

## 2017-10-24 ENCOUNTER — Encounter (HOSPITAL_COMMUNITY): Payer: Self-pay

## 2017-10-24 DIAGNOSIS — Z6841 Body Mass Index (BMI) 40.0 and over, adult: Secondary | ICD-10-CM | POA: Diagnosis not present

## 2017-10-24 DIAGNOSIS — Z87891 Personal history of nicotine dependence: Secondary | ICD-10-CM

## 2017-10-24 DIAGNOSIS — I10 Essential (primary) hypertension: Secondary | ICD-10-CM | POA: Diagnosis present

## 2017-10-24 DIAGNOSIS — M722 Plantar fascial fibromatosis: Secondary | ICD-10-CM | POA: Diagnosis present

## 2017-10-24 DIAGNOSIS — Z888 Allergy status to other drugs, medicaments and biological substances status: Secondary | ICD-10-CM

## 2017-10-24 DIAGNOSIS — Z79899 Other long term (current) drug therapy: Secondary | ICD-10-CM

## 2017-10-24 DIAGNOSIS — E559 Vitamin D deficiency, unspecified: Secondary | ICD-10-CM | POA: Diagnosis present

## 2017-10-24 DIAGNOSIS — F329 Major depressive disorder, single episode, unspecified: Secondary | ICD-10-CM | POA: Diagnosis present

## 2017-10-24 DIAGNOSIS — Z8249 Family history of ischemic heart disease and other diseases of the circulatory system: Secondary | ICD-10-CM | POA: Diagnosis not present

## 2017-10-24 HISTORY — PX: GASTRIC ROUX-EN-Y: SHX5262

## 2017-10-24 LAB — PREGNANCY, URINE: Preg Test, Ur: NEGATIVE

## 2017-10-24 LAB — TYPE AND SCREEN
ABO/RH(D): A POS
Antibody Screen: NEGATIVE

## 2017-10-24 SURGERY — LAPAROSCOPIC ROUX-EN-Y GASTRIC BYPASS WITH UPPER ENDOSCOPY
Anesthesia: General | Site: Abdomen

## 2017-10-24 MED ORDER — SIMETHICONE 80 MG PO CHEW: 80.0000 mg | CHEWABLE_TABLET | Freq: Four times a day (QID) | ORAL | Status: DC | PRN

## 2017-10-24 MED ORDER — SODIUM CHLORIDE 0.9 % IV SOLN
2.0000 g | INTRAVENOUS | Status: AC
  Administered 2017-10-24: 2 g via INTRAVENOUS
  Filled 2017-10-24: qty 2

## 2017-10-24 MED ORDER — 0.9 % SODIUM CHLORIDE (POUR BTL) OPTIME
TOPICAL | Status: DC | PRN
  Administered 2017-10-24: 1000 mL

## 2017-10-24 MED ORDER — LACTATED RINGERS IV SOLN
INTRAVENOUS | Status: DC | PRN
  Administered 2017-10-24 (×2): via INTRAVENOUS

## 2017-10-24 MED ORDER — PROPOFOL 10 MG/ML IV BOLUS
INTRAVENOUS | Status: AC
  Filled 2017-10-24: qty 40

## 2017-10-24 MED ORDER — GABAPENTIN 300 MG PO CAPS
300.0000 mg | ORAL_CAPSULE | ORAL | Status: AC
  Administered 2017-10-24: 300 mg via ORAL
  Filled 2017-10-24: qty 1

## 2017-10-24 MED ORDER — ACETAMINOPHEN 500 MG PO TABS
1000.0000 mg | ORAL_TABLET | ORAL | Status: DC
  Filled 2017-10-24: qty 2

## 2017-10-24 MED ORDER — GABAPENTIN 100 MG PO CAPS
200.0000 mg | ORAL_CAPSULE | Freq: Two times a day (BID) | ORAL | Status: DC
  Administered 2017-10-24 – 2017-10-25 (×2): 200 mg via ORAL
  Filled 2017-10-24 (×2): qty 2

## 2017-10-24 MED ORDER — HYDRALAZINE HCL 20 MG/ML IJ SOLN: 10.0000 mg | INTRAMUSCULAR | Status: DC | PRN

## 2017-10-24 MED ORDER — ONDANSETRON HCL 4 MG/2ML IJ SOLN
INTRAMUSCULAR | Status: AC
  Filled 2017-10-24: qty 2

## 2017-10-24 MED ORDER — HYDROMORPHONE HCL 1 MG/ML IJ SOLN
0.5000 mg | INTRAMUSCULAR | Status: DC | PRN
  Administered 2017-10-24 – 2017-10-25 (×3): 0.5 mg via INTRAVENOUS
  Filled 2017-10-24 (×3): qty 0.5

## 2017-10-24 MED ORDER — LIDOCAINE 2% (20 MG/ML) 5 ML SYRINGE
INTRAMUSCULAR | Status: DC | PRN
  Administered 2017-10-24: 100 mg via INTRAVENOUS

## 2017-10-24 MED ORDER — PROMETHAZINE HCL 25 MG/ML IJ SOLN: 6.2500 mg | INTRAMUSCULAR | Status: DC | PRN

## 2017-10-24 MED ORDER — DEXAMETHASONE SODIUM PHOSPHATE 4 MG/ML IJ SOLN: 4.0000 mg | INTRAMUSCULAR | Status: DC

## 2017-10-24 MED ORDER — BUPIVACAINE-EPINEPHRINE 0.25% -1:200000 IJ SOLN
INTRAMUSCULAR | Status: DC | PRN
  Administered 2017-10-24: 30 mL

## 2017-10-24 MED ORDER — PROPOFOL 10 MG/ML IV BOLUS
INTRAVENOUS | Status: DC | PRN
  Administered 2017-10-24: 250 mg via INTRAVENOUS

## 2017-10-24 MED ORDER — LIDOCAINE HCL 2 % IJ SOLN
INTRAMUSCULAR | Status: AC
  Filled 2017-10-24: qty 20

## 2017-10-24 MED ORDER — LIDOCAINE 2% (20 MG/ML) 5 ML SYRINGE
INTRAMUSCULAR | Status: DC | PRN
  Administered 2017-10-24: 1.5 mg/kg/h via INTRAVENOUS

## 2017-10-24 MED ORDER — SCOPOLAMINE 1 MG/3DAYS TD PT72
1.0000 | MEDICATED_PATCH | TRANSDERMAL | Status: DC
  Administered 2017-10-24: 1.5 mg via TRANSDERMAL
  Filled 2017-10-24: qty 1

## 2017-10-24 MED ORDER — EVICEL 5 ML EX KIT
PACK | Freq: Once | CUTANEOUS | Status: AC
  Administered 2017-10-24: 5 mL
  Filled 2017-10-24: qty 1

## 2017-10-24 MED ORDER — SUCCINYLCHOLINE CHLORIDE 200 MG/10ML IV SOSY
PREFILLED_SYRINGE | INTRAVENOUS | Status: AC
  Filled 2017-10-24: qty 10

## 2017-10-24 MED ORDER — ACETAMINOPHEN 160 MG/5ML PO SOLN
650.0000 mg | Freq: Four times a day (QID) | ORAL | Status: DC
  Administered 2017-10-24 – 2017-10-25 (×3): 650 mg via ORAL
  Filled 2017-10-24 (×3): qty 20.3

## 2017-10-24 MED ORDER — MIDAZOLAM HCL 2 MG/2ML IJ SOLN
INTRAMUSCULAR | Status: AC
  Filled 2017-10-24: qty 2

## 2017-10-24 MED ORDER — ENOXAPARIN SODIUM 40 MG/0.4ML ~~LOC~~ SOLN
40.0000 mg | SUBCUTANEOUS | Status: AC
  Administered 2017-10-24: 40 mg via SUBCUTANEOUS
  Filled 2017-10-24: qty 0.4

## 2017-10-24 MED ORDER — SUGAMMADEX SODIUM 500 MG/5ML IV SOLN
INTRAVENOUS | Status: AC
  Filled 2017-10-24: qty 5

## 2017-10-24 MED ORDER — ONDANSETRON HCL 4 MG/2ML IJ SOLN
INTRAMUSCULAR | Status: DC | PRN
  Administered 2017-10-24: 4 mg via INTRAVENOUS

## 2017-10-24 MED ORDER — ROCURONIUM BROMIDE 10 MG/ML (PF) SYRINGE
PREFILLED_SYRINGE | INTRAVENOUS | Status: AC
  Filled 2017-10-24: qty 10

## 2017-10-24 MED ORDER — SUGAMMADEX SODIUM 200 MG/2ML IV SOLN
INTRAVENOUS | Status: DC | PRN
  Administered 2017-10-24: 350 mg via INTRAVENOUS

## 2017-10-24 MED ORDER — MIDAZOLAM HCL 5 MG/5ML IJ SOLN
INTRAMUSCULAR | Status: DC | PRN
  Administered 2017-10-24: 2 mg via INTRAVENOUS

## 2017-10-24 MED ORDER — LIDOCAINE 2% (20 MG/ML) 5 ML SYRINGE
INTRAMUSCULAR | Status: AC
  Filled 2017-10-24: qty 5

## 2017-10-24 MED ORDER — DEXAMETHASONE SODIUM PHOSPHATE 10 MG/ML IJ SOLN
INTRAMUSCULAR | Status: DC | PRN
  Administered 2017-10-24: 5 mg via INTRAVENOUS

## 2017-10-24 MED ORDER — GLYCOPYRROLATE PF 0.2 MG/ML IJ SOSY
PREFILLED_SYRINGE | INTRAMUSCULAR | Status: DC | PRN
  Administered 2017-10-24: 0.4 mg via INTRAVENOUS

## 2017-10-24 MED ORDER — BUPIVACAINE LIPOSOME 1.3 % IJ SUSP
INTRAMUSCULAR | Status: DC | PRN
  Administered 2017-10-24: 20 mL

## 2017-10-24 MED ORDER — STERILE WATER FOR IRRIGATION IR SOLN
Status: DC | PRN
  Administered 2017-10-24: 1000 mL

## 2017-10-24 MED ORDER — BUPIVACAINE-EPINEPHRINE (PF) 0.25% -1:200000 IJ SOLN
INTRAMUSCULAR | Status: AC
  Filled 2017-10-24: qty 30

## 2017-10-24 MED ORDER — CHLORHEXIDINE GLUCONATE 4 % EX LIQD: 60.0000 mL | Freq: Once | CUTANEOUS | Status: DC

## 2017-10-24 MED ORDER — FENTANYL CITRATE (PF) 250 MCG/5ML IJ SOLN
INTRAMUSCULAR | Status: DC | PRN
  Administered 2017-10-24 (×5): 50 ug via INTRAVENOUS

## 2017-10-24 MED ORDER — SUCCINYLCHOLINE CHLORIDE 200 MG/10ML IV SOSY
PREFILLED_SYRINGE | INTRAVENOUS | Status: DC | PRN
  Administered 2017-10-24: 120 mg via INTRAVENOUS

## 2017-10-24 MED ORDER — LACTATED RINGERS IR SOLN
Status: DC | PRN
  Administered 2017-10-24: 1000 mL

## 2017-10-24 MED ORDER — METOPROLOL TARTRATE 5 MG/5ML IV SOLN: 5.0000 mg | Freq: Four times a day (QID) | INTRAVENOUS | Status: DC | PRN

## 2017-10-24 MED ORDER — FENTANYL CITRATE (PF) 100 MCG/2ML IJ SOLN
25.0000 ug | INTRAMUSCULAR | Status: DC | PRN
  Administered 2017-10-24: 50 ug via INTRAVENOUS

## 2017-10-24 MED ORDER — KETAMINE HCL 10 MG/ML IJ SOLN
INTRAMUSCULAR | Status: DC | PRN
  Administered 2017-10-24: 30 mg via INTRAVENOUS

## 2017-10-24 MED ORDER — FENTANYL CITRATE (PF) 250 MCG/5ML IJ SOLN
INTRAMUSCULAR | Status: AC
  Filled 2017-10-24: qty 5

## 2017-10-24 MED ORDER — FENTANYL CITRATE (PF) 100 MCG/2ML IJ SOLN
INTRAMUSCULAR | Status: AC
  Filled 2017-10-24: qty 2

## 2017-10-24 MED ORDER — DEXAMETHASONE SODIUM PHOSPHATE 10 MG/ML IJ SOLN
INTRAMUSCULAR | Status: AC
  Filled 2017-10-24: qty 1

## 2017-10-24 MED ORDER — OXYCODONE HCL 5 MG/5ML PO SOLN
5.0000 mg | ORAL | Status: DC | PRN
  Administered 2017-10-24 (×2): 5 mg via ORAL
  Administered 2017-10-25 (×2): 10 mg via ORAL
  Administered 2017-10-25: 5 mg via ORAL
  Filled 2017-10-24 (×2): qty 10
  Filled 2017-10-24 (×3): qty 5

## 2017-10-24 MED ORDER — SODIUM CHLORIDE 0.9 % IV SOLN
INTRAVENOUS | Status: DC
  Administered 2017-10-24 – 2017-10-25 (×3): via INTRAVENOUS

## 2017-10-24 MED ORDER — PREMIER PROTEIN SHAKE
2.0000 [oz_av] | ORAL | Status: DC
  Administered 2017-10-25 (×2): 2 [oz_av] via ORAL

## 2017-10-24 MED ORDER — ROCURONIUM BROMIDE 50 MG/5ML IV SOSY
PREFILLED_SYRINGE | INTRAVENOUS | Status: DC | PRN
  Administered 2017-10-24 (×3): 20 mg via INTRAVENOUS
  Administered 2017-10-24: 60 mg via INTRAVENOUS

## 2017-10-24 MED ORDER — ENOXAPARIN SODIUM 30 MG/0.3ML ~~LOC~~ SOLN
30.0000 mg | Freq: Two times a day (BID) | SUBCUTANEOUS | Status: DC
  Administered 2017-10-24 – 2017-10-25 (×2): 30 mg via SUBCUTANEOUS
  Filled 2017-10-24 (×2): qty 0.3

## 2017-10-24 MED ORDER — KETAMINE HCL 10 MG/ML IJ SOLN
INTRAMUSCULAR | Status: AC
  Filled 2017-10-24: qty 1

## 2017-10-24 MED ORDER — CELECOXIB 200 MG PO CAPS
400.0000 mg | ORAL_CAPSULE | ORAL | Status: AC
  Administered 2017-10-24: 400 mg via ORAL
  Filled 2017-10-24: qty 2

## 2017-10-24 MED ORDER — APREPITANT 40 MG PO CAPS
40.0000 mg | ORAL_CAPSULE | ORAL | Status: AC
  Administered 2017-10-24: 40 mg via ORAL
  Filled 2017-10-24: qty 1

## 2017-10-24 MED ORDER — FAMOTIDINE IN NACL 20-0.9 MG/50ML-% IV SOLN
20.0000 mg | Freq: Two times a day (BID) | INTRAVENOUS | Status: DC
  Administered 2017-10-24 – 2017-10-25 (×3): 20 mg via INTRAVENOUS
  Filled 2017-10-24 (×3): qty 50

## 2017-10-24 MED ORDER — ONDANSETRON HCL 4 MG/2ML IJ SOLN
4.0000 mg | INTRAMUSCULAR | Status: DC | PRN
  Administered 2017-10-25: 4 mg via INTRAVENOUS
  Filled 2017-10-24: qty 2

## 2017-10-24 SURGICAL SUPPLY — 79 items
ADH SKN CLS APL DERMABOND .7 (GAUZE/BANDAGES/DRESSINGS)
APL SKNCLS STERI-STRIP NONHPOA (GAUZE/BANDAGES/DRESSINGS) ×1
APPLIER CLIP ROT 13.4 12 LRG (CLIP)
APR CLP LRG 13.4X12 ROT 20 MLT (CLIP)
BANDAGE ADH SHEER 1  50/CT (GAUZE/BANDAGES/DRESSINGS) ×18 IMPLANT
BENZOIN TINCTURE PRP APPL 2/3 (GAUZE/BANDAGES/DRESSINGS) ×3 IMPLANT
BLADE SURG SZ11 CARB STEEL (BLADE) ×3 IMPLANT
CABLE HIGH FREQUENCY MONO STRZ (ELECTRODE) ×3 IMPLANT
CHLORAPREP W/TINT 26ML (MISCELLANEOUS) ×6 IMPLANT
CLIP APPLIE ROT 13.4 12 LRG (CLIP) IMPLANT
CLIP SUT LAPRA TY ABSORB (SUTURE) ×6 IMPLANT
CLOSURE WOUND 1/2 X4 (GAUZE/BANDAGES/DRESSINGS) ×1
COVER SURGICAL LIGHT HANDLE (MISCELLANEOUS) ×3 IMPLANT
DERMABOND ADVANCED (GAUZE/BANDAGES/DRESSINGS)
DERMABOND ADVANCED .7 DNX12 (GAUZE/BANDAGES/DRESSINGS) IMPLANT
DEVICE SUT QUICK LOAD TK 5 (STAPLE) IMPLANT
DEVICE SUT TI-KNOT TK 5X26 (MISCELLANEOUS) IMPLANT
DEVICE SUTURE ENDOST 10MM (ENDOMECHANICALS) ×3 IMPLANT
DEVICE TI KNOT TK5 (MISCELLANEOUS)
DRAIN PENROSE 18X1/4 LTX STRL (WOUND CARE) ×3 IMPLANT
ELECT REM PT RETURN 15FT ADLT (MISCELLANEOUS) ×3 IMPLANT
GAUZE 4X4 16PLY RFD (DISPOSABLE) ×3 IMPLANT
GAUZE SPONGE 4X4 12PLY STRL (GAUZE/BANDAGES/DRESSINGS) IMPLANT
GLOVE BIO SURGEON STRL SZ 6 (GLOVE) ×3 IMPLANT
GLOVE INDICATOR 6.5 STRL GRN (GLOVE) ×3 IMPLANT
GOWN STRL REUS W/TWL LRG LVL3 (GOWN DISPOSABLE) ×3 IMPLANT
GOWN STRL REUS W/TWL XL LVL3 (GOWN DISPOSABLE) ×11 IMPLANT
HOVERMATT SINGLE USE (MISCELLANEOUS) ×3 IMPLANT
KIT BASIN OR (CUSTOM PROCEDURE TRAY) ×3 IMPLANT
KIT GASTRIC LAVAGE 34FR ADT (SET/KITS/TRAYS/PACK) ×3 IMPLANT
LUBRICANT JELLY K Y 4OZ (MISCELLANEOUS) ×1 IMPLANT
MARKER SKIN DUAL TIP RULER LAB (MISCELLANEOUS) ×3 IMPLANT
NDL SPNL 22GX3.5 QUINCKE BK (NEEDLE) ×1 IMPLANT
NEEDLE SPNL 22GX3.5 QUINCKE BK (NEEDLE) ×3 IMPLANT
PACK CARDIOVASCULAR III (CUSTOM PROCEDURE TRAY) ×3 IMPLANT
QUICK LOAD TK 5 (STAPLE)
RELOAD ENDO STITCH 2.0 (ENDOMECHANICALS) ×27
RELOAD STAPLE 60 2.6 WHT THN (STAPLE) IMPLANT
RELOAD STAPLE 60 3.6 BLU REG (STAPLE) IMPLANT
RELOAD STAPLE 60 3.8 GOLD REG (STAPLE) IMPLANT
RELOAD STAPLER BLUE 60MM (STAPLE) ×5 IMPLANT
RELOAD STAPLER GOLD 60MM (STAPLE) ×1 IMPLANT
RELOAD STAPLER WHITE 60MM (STAPLE) ×2 IMPLANT
RELOAD SUT SNGL STCH ABSRB 2-0 (ENDOMECHANICALS) ×4 IMPLANT
RELOAD SUT SNGL STCH BLK 2-0 (ENDOMECHANICALS) ×4 IMPLANT
SCISSORS LAP 5X45 EPIX DISP (ENDOMECHANICALS) ×3 IMPLANT
SET IRRIG TUBING LAPAROSCOPIC (IRRIGATION / IRRIGATOR) ×3 IMPLANT
SHEARS HARMONIC ACE PLUS 45CM (MISCELLANEOUS) ×3 IMPLANT
SLEEVE ADV FIXATION 12X100MM (TROCAR) ×3 IMPLANT
SLEEVE ADV FIXATION 5X100MM (TROCAR) ×6 IMPLANT
SLEEVE XCEL OPT CAN 5 100 (ENDOMECHANICALS) IMPLANT
SOLUTION ANTI FOG 6CC (MISCELLANEOUS) ×3 IMPLANT
STAPLER ECHELON BIOABSB 60 FLE (MISCELLANEOUS) IMPLANT
STAPLER ECHELON LONG 60 440 (INSTRUMENTS) ×3 IMPLANT
STAPLER RELOAD BLUE 60MM (STAPLE) ×15
STAPLER RELOAD GOLD 60MM (STAPLE) ×3
STAPLER RELOAD WHITE 60MM (STAPLE) ×6
STRIP CLOSURE SKIN 1/2X4 (GAUZE/BANDAGES/DRESSINGS) ×2 IMPLANT
SUT MNCRL AB 4-0 PS2 18 (SUTURE) ×3 IMPLANT
SUT RELOAD ENDO STITCH 2 48X1 (ENDOMECHANICALS) ×5
SUT RELOAD ENDO STITCH 2.0 (ENDOMECHANICALS) ×4
SUT SURGIDAC NAB ES-9 0 48 120 (SUTURE) IMPLANT
SUT VIC AB 2-0 SH 27 (SUTURE) ×3
SUT VIC AB 2-0 SH 27X BRD (SUTURE) ×1 IMPLANT
SUTURE RELOAD END STTCH 2 48X1 (ENDOMECHANICALS) ×5 IMPLANT
SUTURE RELOAD ENDO STITCH 2.0 (ENDOMECHANICALS) ×4 IMPLANT
SYR 10ML ECCENTRIC (SYRINGE) ×3 IMPLANT
SYR 20CC LL (SYRINGE) ×6 IMPLANT
TIP RIGID 35CM EVICEL (HEMOSTASIS) ×3 IMPLANT
TOWEL OR 17X26 10 PK STRL BLUE (TOWEL DISPOSABLE) ×3 IMPLANT
TOWEL OR NON WOVEN STRL DISP B (DISPOSABLE) ×3 IMPLANT
TROCAR ADV FIXATION 12X100MM (TROCAR) ×3 IMPLANT
TROCAR ADV FIXATION 5X100MM (TROCAR) ×3 IMPLANT
TROCAR BLADELESS OPT 5 100 (ENDOMECHANICALS) IMPLANT
TROCAR XCEL 12X100 BLDLESS (ENDOMECHANICALS) ×3 IMPLANT
TUBING CONNECTING 10 (TUBING) ×2 IMPLANT
TUBING CONNECTING 10' (TUBING) ×2
TUBING ENDO SMARTCAP PENTAX (MISCELLANEOUS) ×3 IMPLANT
TUBING INSUF HEATED (TUBING) ×3 IMPLANT

## 2017-10-24 NOTE — Anesthesia Preprocedure Evaluation (Signed)
Anesthesia Evaluation  Patient identified by MRN, date of birth, ID band Patient awake    Reviewed: Allergy & Precautions, NPO status , Patient's Chart, lab work & pertinent test results  Airway Mallampati: II  TM Distance: >3 FB Neck ROM: Full    Dental  (+) Teeth Intact, Dental Advisory Given   Pulmonary former smoker,    Pulmonary exam normal breath sounds clear to auscultation       Cardiovascular hypertension, Pt. on medications Normal cardiovascular exam Rhythm:Regular Rate:Normal     Neuro/Psych PSYCHIATRIC DISORDERS Depression negative neurological ROS     GI/Hepatic negative GI ROS, Neg liver ROS,   Endo/Other  Morbid obesity  Renal/GU negative Renal ROS     Musculoskeletal negative musculoskeletal ROS (+)   Abdominal   Peds  Hematology negative hematology ROS (+)   Anesthesia Other Findings Day of surgery medications reviewed with the patient.  Reproductive/Obstetrics                             Anesthesia Physical Anesthesia Plan  ASA: IV  Anesthesia Plan: General   Post-op Pain Management:    Induction: Intravenous  PONV Risk Score and Plan: 4 or greater and Scopolamine patch - Pre-op, Midazolam, Dexamethasone and Ondansetron  Airway Management Planned: Oral ETT  Additional Equipment:   Intra-op Plan:   Post-operative Plan: Extubation in OR  Informed Consent: I have reviewed the patients History and Physical, chart, labs and discussed the procedure including the risks, benefits and alternatives for the proposed anesthesia with the patient or authorized representative who has indicated his/her understanding and acceptance.   Dental advisory given  Plan Discussed with: CRNA  Anesthesia Plan Comments:         Anesthesia Quick Evaluation

## 2017-10-24 NOTE — Anesthesia Procedure Notes (Signed)
Procedure Name: Intubation Date/Time: 10/24/2017 7:32 AM Performed by: Maxwell Caul, CRNA Pre-anesthesia Checklist: Patient identified, Emergency Drugs available, Suction available and Patient being monitored Patient Re-evaluated:Patient Re-evaluated prior to induction Oxygen Delivery Method: Circle system utilized Preoxygenation: Pre-oxygenation with 100% oxygen Induction Type: IV induction Ventilation: Mask ventilation without difficulty Laryngoscope Size: Mac and 4 Grade View: Grade I Tube type: Oral Tube size: 7.5 mm Number of attempts: 1 Airway Equipment and Method: Stylet and Oral airway Placement Confirmation: ETT inserted through vocal cords under direct vision,  positive ETCO2 and breath sounds checked- equal and bilateral Secured at: 23 cm Tube secured with: Tape Dental Injury: Teeth and Oropharynx as per pre-operative assessment

## 2017-10-24 NOTE — H&P (Addendum)
Surgical H&P  CC: morbid obesity  HPI: Here for preop visit. Negative urine nicotine/cotinine on 1/30. Has completed the preop pathway. Deemed appropriate by psych ad nutrition. Labs, UGI and CXR unremarkable.   She has cut out soda! Since I saw her in February, she has continued to abstain from smoking and soda. Repeat urine nicotine/ cotinine last week was negative. She has done the preop diet and has dropped her BMI from 61 in Feb to 9057 today.   initial visit 01/27/17: Very pleasant 47 year old woman who presents today to discuss weight loss surgery. She is interested in gastric bypass. She actually went through the entire process was set up for surgery with Dr. Ezzard StandingNewman in 2014, but her surgery was canceled as she was not compliant with her preoperative diet. She has been dealing with weight her entire life. Her entire mother's side of the family is overweight. She has a family history of obesity, hypertension, heart disease etc. She herself has mild hypertension but no other comorbidities, denies reflux. No known diabetes. She has tried many things to lose weight. Her most successful attempt was early 4620s when she was on Fen/Phen and lost about 100 pounds. She slowly gaining this back, and then went through 2 miscarriages followed by a third pregnancy was delivered at 7 months, and through each of these gaining horn more weight. She is currently at her highest adult weight. She experiences general fatigue and is generally unable to participate in the activities that she would like to do with her daughter. She does admit that her diet at this time is not well balanced or healthy. 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 No exercise. On her days off the time that she enjoys spinning with her daughters usually spent going on for a meal and there is no real limitation or consideration balancing her dietary intake. She admits that she can  drink about 2 L of soda in a day for example. She states that she quit smoking 2 months ago.   plantar fasciitis obesity 1ppd smoker (quit) vit d deficiency- replaced by pcp depression- zoloft htn-chlorthalidone.      Allergies  Allergen Reactions  . Lamisil [Terbinafine] Hives        Past Medical History:  Diagnosis Date  . Hypertension   . Miscarriage 2000,2001  . Obesity   . Plantar fasciitis, bilateral    With heel spurs.  . Premature birth 2011   34 Weeks.         Past Surgical History:  Procedure Laterality Date  . BREATH TEK H PYLORI N/A 06/28/2012   Procedure: BREATH TEK H PYLORI;  Surgeon: Kandis Cockingavid H Newman, MD;  [facility];  Service: General;  Laterality: N/A;         Family History  Problem Relation Age of Onset  . Diabetes Mother   . Congestive Heart Failure Mother   . COPD Father   . Cancer Maternal Aunt        Breast  . Cancer Maternal Aunt        Breast  . Hypertension Other     Social History        Socioeconomic History  . Marital status: Single    Spouse name: Not on file  . Number of children: Not on file  . Years of education: Not on file  . Highest education level: Not on file  Social Needs  .  Financial resource strain: Not on file  . Food insecurity - worry: Never true  . Food insecurity - inability: Never true  . Transportation needs - medical: Not on file  . Transportation needs - non-medical: Not on file  Occupational History  . Not on file  Tobacco Use  . Smoking status: Current Every Day Smoker    Packs/day: 1.00    Types: Cigarettes  . Smokeless tobacco: Never Used  . Tobacco comment: Patient states she knows she has to quit prior to sugery. Trying to quit. Has went from 1 pack/day to 0.50 pack or less daily.  Substance and Sexual Activity  . Alcohol use: No  . Drug use: No  . Sexual activity: No  Other Topics Concern  . Not on file  Social History Narrative  . Not on  file          Current Outpatient Medications on File Prior to Visit  Medication Sig Dispense Refill  . chlorthalidone (HYGROTON) 25 MG tablet Take 1 tablet (25 mg total) daily by mouth. 30 tablet 3  . ibuprofen (ADVIL,MOTRIN) 200 MG tablet Take 200 mg by mouth every 6 (six) hours as needed.    . Misc Natural Products (OSTEO BI-FLEX JOINT SHIELD PO) Take by mouth.    . Multiple Vitamins-Minerals (MULTIVITAMIN WITH MINERALS) tablet Take 1 tablet by mouth daily.    . sertraline (ZOLOFT) 50 MG tablet Take 2 tablets (100 mg total) by mouth daily. (Patient not taking: Reported on 02/18/2017) 60 tablet 2  . Vitamin D, Ergocalciferol, (DRISDOL) 50000 units CAPS capsule Take 1 capsule (50,000 Units total) by mouth every 7 (seven) days. (Patient not taking: Reported on 02/18/2017) 8 capsule 0   No current facility-administered medications on file prior to visit.     Review of Systems: a complete, 10pt review of systems was completed with pertinent positives and negatives as documented in the HPI  Physical Exam: There were no vitals filed for this visit. Gen: A&Ox3, no distress  Head: normocephalic, atraumatic Eyes: extraocular motions intact, anicteric.  Neck: supple without mass or thyromegaly Chest: unlabored respirations, symmetrical air entry, clear bilaterally   Cardiovascular: RRR with palpable distal pulses, no pedal edema Abdomen: obese, soft, nondistended, nontender. No mass or organomegaly.  Extremities: warm, without edema, no deformities  Neuro: grossly intact Psych: appropriate mood and affect, normal insight  Skin: warm and dry   CBC Latest Ref Rng & Units 08/09/2016 06/14/2012 09/12/2009  WBC 3.4 - 10.8 x10E3/uL 6.4 6.6 19.8(H)  Hemoglobin 11.1 - 15.9 g/dL 16.1 09.6 9.9(L)  Hematocrit 34.0 - 46.6 % 41.9 42.8 28.2(L)  Platelets 150 - 379 x10E3/uL 378 391 328    CMP Latest Ref Rng & Units 09/07/2016 08/09/2016 06/28/2012  Glucose 65 - 99 mg/dL 94 94 93  BUN 6 - 24  mg/dL 14 12 15   Creatinine 0.57 - 1.00 mg/dL 0.45 4.09 8.11  Sodium 134 - 144 mmol/L 140 139 137  Potassium 3.5 - 5.2 mmol/L 5.2 4.3 4.6  Chloride 96 - 106 mmol/L 100 104 106  CO2 18 - 29 mmol/L 28 20 22   Calcium 8.7 - 10.2 mg/dL 8.9 9.1(Y) 8.7  Total Protein 6.0 - 8.5 g/dL - 6.4 6.8  Total Bilirubin 0.0 - 1.2 mg/dL - <7.8 0.3  Alkaline Phos 39 - 117 IU/L - 77 63  AST 0 - 40 IU/L - 22 24  ALT 0 - 32 IU/L - 21 20    RecentLabs  No results found for: INR, PROTIME  Imaging: ImagingResults(Last48hours)  No results found.     A/P: To OR for roux en y gastric bypass. The risks of the surgery have been discussed at length with the patient, infection, scarring, hernia, anastomotic leak, internal hernia, ulcer, injury to intra-abdominal structures, chronic abdominal pain and chronic nausea.  Questions have been welcomed and answered.   Sierra Blakes, MD [facility], [place] Pager [phone]

## 2017-10-24 NOTE — Progress Notes (Signed)
Discussed post op day goals with patient including ambulation, IS, diet progression, pain, and nausea control.  Questions answered. 

## 2017-10-24 NOTE — Op Note (Signed)
Preoperative diagnosis: Roux-en-Y gastric bypass  Postoperative diagnosis: Same   Procedure: Upper endoscopy   Surgeon: Md Smola, M.D.  Anesthesia: Gen.   Indications for procedure: This patient was undergoing a Roux-en-Y gastric bypass.   Description of procedure: The endoscopy was placed in the mouth and into the oropharynx and under endoscopic vision it was advanced to the esophagogastric junction. The pouch was insufflated and no bleeding or bubbles were seen. The GEJ was identified at 39 cm from the teeth. The anastomosis was seen at 45 cm. No bleeding or leaks were detected. The scope was withdrawn without difficulty.   Alla Sloma, M.D. General, Bariatric, & Minimally Invasive Surgery [facility]    

## 2017-10-24 NOTE — Progress Notes (Signed)
Water started at 1520. Pt also walked in hall and used incentive spirometer as well.

## 2017-10-24 NOTE — Transfer of Care (Signed)
Immediate Anesthesia Transfer of Care Note  Patient: Sierra Moreno  Procedure(s) Performed: LAPAROSCOPIC ROUX-EN-Y GASTRIC BYPASS WITH UPPER ENDOSCOPY, ERAS PATHWAY (N/A Abdomen)  Patient Location: PACU  Anesthesia Type:General  Level of Consciousness: awake, alert  and oriented  Airway & Oxygen Therapy: Patient Spontanous Breathing and Patient connected to face mask oxygen  Post-op Assessment: Report given to RN and Post -op Vital signs reviewed and stable  Post vital signs: Reviewed and stable  Last Vitals:  Vitals Value Taken Time  BP 126/73 10/24/2017 10:21 AM  Temp    Pulse 83 10/24/2017 10:22 AM  Resp 19 10/24/2017 10:22 AM  SpO2 100 % 10/24/2017 10:22 AM  Vitals shown include unvalidated device data.  Last Pain:  Vitals:   10/24/17 0622  TempSrc:   PainSc: 0-No pain         Complications: No apparent anesthesia complications

## 2017-10-24 NOTE — Anesthesia Postprocedure Evaluation (Signed)
Anesthesia Post Note  Patient: Taetum A Grieshop  Procedure(s) Performed: LAPAROSCOPIC ROUX-EN-Y GASTRIC BYPASS WITH UPPER ENDOSCOPY, ERAS PATHWAY (N/A Abdomen)     Patient location during evaluation: PACU Anesthesia Type: General Level of consciousness: awake and alert Pain management: pain level controlled Vital Signs Assessment: post-procedure vital signs reviewed and stable Respiratory status: spontaneous breathing, nonlabored ventilation, respiratory function stable and patient connected to nasal cannula oxygen Cardiovascular status: blood pressure returned to baseline and stable Postop Assessment: no apparent nausea or vomiting Anesthetic complications: no    Last Vitals:  Vitals:   10/24/17 1316 10/24/17 1412  BP: 124/86 136/86  Pulse: 64 64  Resp:  20  Temp: 36.7 C 36.8 C  SpO2: 98% 98%    Last Pain:  Vitals:   10/24/17 1412  TempSrc: Oral  PainSc: 6                  Cecile HearingStephen Edward Norelle Runnion

## 2017-10-24 NOTE — Discharge Instructions (Signed)
° ° ° °GASTRIC BYPASS/SLEEVE ° Home Care Instructions ° ° These instructions are to help you care for yourself when you go home. ° °Call: If you have any problems. °• Call [phone] and ask for the surgeon on call °• If you need immediate help, come to the ER at [facility].  °• Tell the ER staff that you are a new post-op gastric bypass or gastric sleeve patient °  °Signs and symptoms to report: • Severe vomiting or nausea °o If you cannot keep down clear liquids for longer than 1 day, call your surgeon  °• Abdominal pain that does not get better after taking your pain medication °• Fever over 100.4° F with chills °• Heart beating over 100 beats a minute °• Shortness of breath at rest °• Chest pain °•  Redness, swelling, drainage, or foul odor at incision (surgical) sites °•  If your incisions open or pull apart °• Swelling or pain in calf (lower leg) °• Diarrhea (Loose bowel movements that happen often), frequent watery, uncontrolled bowel movements °• Constipation, (no bowel movements for 3 days) if this happens: Pick one °o Milk of Magnesia, 2 tablespoons by mouth, 3 times a day for 2 days if needed °o Stop taking Milk of Magnesia once you have a bowel movement °o Call your doctor if constipation continues °Or °o Miralax  (instead of Milk of Magnesia) following the label instructions °o Stop taking Miralax once you have a bowel movement °o Call your doctor if constipation continues °• Anything you think is not normal °  °Normal side effects after surgery: • Unable to sleep at night or unable to focus °• Irritability or moody °• Being tearful (crying) or depressed °These are common complaints, possibly related to your anesthesia medications that put you to sleep, stress of surgery, and change in lifestyle.  This usually goes away a few weeks after surgery.  If these feelings continue, call your primary care doctor. °  °Wound Care: You may have surgical glue, steri-strips, or staples over your incisions after  surgery °• Surgical glue:  Looks like a clear film over your incisions and will wear off a little at a time °• Steri-strips: Strips of tape over your incisions. You may notice a yellowish color on the skin under the steri-strips. This is used to make the   steri-strips stick better. Do not pull the steri-strips off - let them fall off °• Staples: Staples may be removed before you leave the hospital °o If you go home with staples, call [facility], [phone] at for an appointment with your surgeon’s nurse to have staples removed 10 days after surgery. °• Showering: You may shower two (2) days after your surgery unless your surgeon tells you differently °o Wash gently around incisions with warm soapy water, rinse well, and gently pat dry  °o No tub baths until staples are removed, steri-strips fall off or glue is gone.  °  °Medications: • Medications should be liquid or crushed if larger than the size of a dime °• Extended release pills (medication that release a little bit at a time through the day) should NOT be crushed or cut. (examples include XL, ER, DR, SR) °• Depending on the size and number of medications you take, you may need to space (take a few throughout the day)/change the time you take your medications so that you do not over-fill your pouch (smaller stomach) °• Make sure you follow-up with your primary care doctor to   make medication changes needed during rapid weight loss and life-style changes °• If you have diabetes, follow up with the doctor that orders your diabetes medication(s) within one week after surgery and check your blood sugar regularly. °• Do not drive while taking prescription pain medication  °• It is ok to take Tylenol by the bottle instructions with your pain medicine or instead of your pain medicine as needed.  DO NOT TAKE NSAIDS (EXAMPLES OF NSAIDS:  IBUPROFREN/ NAPROXEN)  °Diet:                    First 2 Weeks ° You will see the dietician t about two (2) weeks  after your surgery. The dietician will increase the types of foods you can eat if you are handling liquids well: °• If you have severe vomiting or nausea and cannot keep down clear liquids lasting longer than 1 day, call your surgeon @ ([phone]) °Protein Shake °• Drink at least 2 ounces of shake 5-6 times per day °• Each serving of protein shakes (usually 8 - 12 ounces) should have: °o 15 grams of protein  °o And no more than 5 grams of carbohydrate  °• Goal for protein each day: °o Men = 80 grams per day °o Women = 60 grams per day °• Protein powder may be added to fluids such as non-fat milk or Lactaid milk or unsweetened Soy/Almond milk (limit to 35 grams added protein powder per serving) ° °Hydration °• Slowly increase the amount of water and other clear liquids as tolerated (See Acceptable Fluids) °• Slowly increase the amount of protein shake as tolerated  °•  Sip fluids slowly and throughout the day.  Do not use straws. °• May use sugar substitutes in small amounts (no more than 6 - 8 packets per day; i.e. Splenda) ° °Fluid Goal °• The first goal is to drink at least 8 ounces of protein shake/drink per day (or as directed by the nutritionist); some examples of protein shakes are Syntrax Nectar, Adkins Advantage, EAS Edge HP, and Unjury. See handout from pre-op Bariatric Education Class: °o Slowly increase the amount of protein shake you drink as tolerated °o You may find it easier to slowly sip shakes throughout the day °o It is important to get your proteins in first °• Your fluid goal is to drink 64 - 100 ounces of fluid daily °o It may take a few weeks to build up to this °• 32 oz (or more) should be clear liquids  °And  °• 32 oz (or more) should be full liquids (see below for examples) °• Liquids should not contain sugar, caffeine, or carbonation ° °Clear Liquids: °• Water or Sugar-free flavored water (i.e. Fruit H2O, Propel) °• Decaffeinated coffee or tea (sugar-free) °• Crystal Lite, Wyler’s Lite,  Minute Maid Lite °• Sugar-free Jell-O °• Bouillon or broth °• Sugar-free Popsicle:   *Less than 20 calories each; Limit 1 per day ° °Full Liquids: °Protein Shakes/Drinks + 2 choices per day of other full liquids °• Full liquids must be: °o No More Than 15 grams of Carbs per serving  °o No More Than 3 grams of Fat per serving °• Strained low-fat cream soup (except Cream of Potato or Tomato) °• Non-Fat milk °• Fat-free Lactaid Milk °• Unsweetened Soy Or Unsweetened Almond Milk °• Low Sugar yogurt (Dannon Lite & Fit, Greek yogurt; Oikos Triple Zero; Chobani Simply 100; Yoplait 100 calorie Greek - No Fruit on the Bottom) ° °  °Vitamins   and Minerals • Start 1 day after surgery unless otherwise directed by your surgeon °• 2 Chewable Bariatric Specific Multivitamin / Multimineral Supplement with iron (Example: Bariatric Advantage Multi EA) °• Chewable Calcium with Vitamin D-3 °(Example: 3 Chewable Calcium Plus 600 with Vitamin D-3) °o Take 500 mg three (3) times a day for a total of 1500 mg each day °o Do not take all 3 doses of calcium at one time as it may cause constipation, and you can only absorb 500 mg  at a time  °o Do not mix multivitamins containing iron with calcium supplements; take 2 hours apart °• Menstruating women and those with a history of anemia (a blood disease that causes weakness) may need extra iron °o Talk with your doctor to see if you need more iron °• Do not stop taking or change any vitamins or minerals until you talk to your dietitian or surgeon °• Your Dietitian and/or surgeon must approve all vitamin and mineral supplements °  °Activity and Exercise: Limit your physical activity as instructed by your doctor.  It is important to continue walking at home.  During this time, use these guidelines: °• Do not lift anything greater than ten (10) pounds for at least two (2) weeks °• Do not go back to work or drive until your surgeon says you can °• You may have sex when you feel comfortable  °o It is  VERY important for female patients to use a reliable birth control method; fertility often increases after surgery  °o All hormonal birth control will be ineffective for 30 days after surgery due to medications given during surgery a barrier method must be used. °o Do not get pregnant for at least 18 months °• Start exercising as soon as your doctor tells you that you can °o Make sure your doctor approves any physical activity °• Start with a simple walking program °• Walk 5-15 minutes each day, 7 days per week.  °• Slowly increase until you are walking 30-45 minutes per day °Consider joining our BELT program. [phone] or email [email or web address] °  °Special Instructions Things to remember: °• Use your CPAP when sleeping if this applies to you ° °• [facility] has two free Bariatric Surgery Support Groups that meet monthly °o The 3rd Thursday of each month, 6 pm, [facility]  °o The 2nd Friday of each month, 11:45 am in the private dining room in the basement of [facility] °• It is very important to keep all follow up appointments with your surgeon, dietitian, primary care physician, and behavioral health practitioner °• Routine follow up schedule with your surgeon include appointments at 2-3 weeks, 6-8 weeks, 6 months, and 1 year at a minimum.  Your surgeon may request to see you more often.   °o After the first year, please follow up with your bariatric surgeon and dietitian at least once a year in order to maintain best weight loss results °[facility]: [phone] °[facility]: [phone] °Bariatric Nurse Coordinator: [phone] °  °   Reviewed and Endorsed  °by [facility] Patient Education Committee, June, 2016 °Edits Approved: Aug, 2018 ° ° ° °

## 2017-10-24 NOTE — Op Note (Signed)
Operative Note  Sierra Moreno  [id number]  [id number]  10/24/2017   Surgeon: Berna Bue MD   Assistant: Feliciana Rossetti MD   Procedure performed: laparoscopic Roux-en-Y gastric bypass (antecolic, antegastric) , upper endoscopy   Preop diagnosis: Morbid obesity Body mass index is 57.46 kg/m., hypertension Post-op diagnosis/intraop findings: same   Specimens: none Retained items: none  EBL: minimal cc Complications: none   Description of procedure: After obtaining informed consent and administration of prophylactic lovenox in holding, the patient was taken to the operating room and placed supine on operating room table where general endotracheal anesthesia was initiated, preoperative antibiotics were administered, SCDs applied, and a formal timeout was performed. The abdomen was prepped and draped in usual sterile fashion. Peritoneal access was gained using a Visiport technique in the left upper quadrant and insufflation to 15 mmHg ensued without issue. Gross inspection revealed no evidence of injury or other intraabdominal abnormality. Under direct visualization the remaining trochars were inserted. A laparoscopic assisted bilateral taps block was performed using Exparel mixed with Marcaine. The omentum was reflected cephalad and the ligament of Treitz identified. The small bowel was followed to a point 40 cm distal to ligament of Treitz at which location the bowel was divided with a white load linear cutting stapler. A Penrose was sutured to the Roux side of the staple line for future identification. The bowel was measured another 100 cm distal to this and and the site for the jejunojejunostomy was aligned with the end of the biliopancreatic limb. Enterotomies were made with the Harmonic scalpel and the anastomosis was created with the 60 mm white load linear cutting stapler. The common enterotomy was closed with running 3-0 Vicryl starting on either end and tying centrally. The mesenteric  defect was closed with running silk suture secured with Lapra-Ty's. The anastomosis was inspected and appeared widely patent, hemostatic with no gaps in the suture line. Evicel was injected over the anastomosis. We then divided the omentum using the harmonic scalpel. The patient was then placed in steep reverse Trendelenburg. The liver retractor was inserted through a subxiphoid incision and secured for fixed retraction of the left lobe. The Harmonic scalpel was used to enter the perigastric plane and the lesser sac at a point 5 cm distal to the GE junction on the lesser curve. The angle of His was gently bluntly dissected and the target shape of the pouch visualized to exclude any residual fundus. After confirming that all tubes have been removed from the stomach, the gastric pouch was created with serial fires of the linear cutting stapler. As we approached the angle of His, the Ewald tube was inserted to confirm no impingement on the GE junction. The Roux limb with its attached Penrose drain was then identified and brought up to meet the gastric pouch ensuring no twist in the small bowel mesentery. The staple line of the small bowel is directed to the patient's left side. A running 3-0 Vicryl was used to create a posterior suture line for our anastomosis between the gastric pouch and the small bowel. Gastrotomy and enterotomy was made with the Harmonic scalpel and a blue load linear cutting stapler was used to create a gastrojejunal anastomosis approximately 2.5cm wide. The common enterotomy was closed with running 3-0 Vicryl starting at either end and tying centrally. At this juncture the Ewald tube was passed through the gastrojejunal anastomosis. An anterior layer of running 3-0 Vicryl was used to complete the gastrojejunal anastomosis. The Ewald tube was removed without  difficulty. The [place] space was closed with a figure-of-eight silk suture. At this point the assistant performed an upper endoscopy with  the Roux limb gently clamped with a bowel clamp. Irrigation is instilled in the upper abdomen for a leak test. Please see his separate operative note- the anastomosis is noted to be patent and hemostatic without any leak or bubbles present. The endoscope was removed and the abdomen once again surveyed. The remaining evicel was sprayed over the gastrojejunal anastomosis. The liver retractor was removed under direct visualization. The abdomen was then desufflated and all remaining trochars removed. The skin incisions were closed with running subcuticular Monocryl; benzoin, Steri-Strips and Band-Aids were applied The patient was then awakened, extubated and taken to PACU in stable condition.     All counts were correct at the completion of the case.

## 2017-10-25 ENCOUNTER — Encounter (HOSPITAL_COMMUNITY): Payer: Self-pay | Admitting: Surgery

## 2017-10-25 LAB — CBC WITH DIFFERENTIAL/PLATELET
Basophils Absolute: 0 10*3/uL (ref 0.0–0.1)
Basophils Relative: 0 %
Eosinophils Absolute: 0 10*3/uL (ref 0.0–0.7)
Eosinophils Relative: 0 %
HCT: 41.4 % (ref 36.0–46.0)
Hemoglobin: 13.6 g/dL (ref 12.0–15.0)
LYMPHS ABS: 1.5 10*3/uL (ref 0.7–4.0)
LYMPHS PCT: 10 %
MCH: 30.6 pg (ref 26.0–34.0)
MCHC: 32.9 g/dL (ref 30.0–36.0)
MCV: 93.2 fL (ref 78.0–100.0)
MONO ABS: 1.5 10*3/uL (ref 0.1–1.0)
MONOS PCT: 9 %
Neutro Abs: 12.8 10*3/uL (ref 1.7–7.7)
Neutrophils Relative %: 81 %
Platelets: 395 10*3/uL (ref 150–400)
RBC: 4.44 MIL/uL (ref 3.87–5.11)
RDW: 13.6 % (ref 11.5–15.5)
WBC: 15.9 10*3/uL — ABNORMAL HIGH (ref 4.0–10.5)

## 2017-10-25 LAB — COMPREHENSIVE METABOLIC PANEL
ALT: 95 U/L — ABNORMAL HIGH (ref 0–44)
ANION GAP: 7 (ref 5–15)
AST: 118 U/L — ABNORMAL HIGH (ref 15–41)
Albumin: 3.3 g/dL — ABNORMAL LOW (ref 3.5–5.0)
Alkaline Phosphatase: 65 U/L (ref 38–126)
BUN: 13 mg/dL (ref 6–20)
CHLORIDE: 101 mmol/L (ref 98–111)
CO2: 27 mmol/L (ref 22–32)
Calcium: 8.1 mg/dL — ABNORMAL LOW (ref 8.9–10.3)
Creatinine, Ser: 0.8 mg/dL (ref 0.44–1.00)
GFR calc non Af Amer: >60 mL/min (ref >60)
Glucose, Bld: 121 mg/dL — ABNORMAL HIGH (ref 70–99)
POTASSIUM: 4.2 mmol/L (ref 3.5–5.1)
SODIUM: 135 mmol/L (ref 135–145)
Total Bilirubin: 0.6 mg/dL (ref 0.3–1.2)
Total Protein: 6.7 g/dL (ref 6.5–8.1)

## 2017-10-25 MED ORDER — OXYCODONE HCL 5 MG/5ML PO SOLN: 5.0000 mg | ORAL | 0 refills | Status: AC | PRN

## 2017-10-25 MED ORDER — DOCUSATE SODIUM 100 MG PO CAPS: 100.0000 mg | ORAL_CAPSULE | Freq: Two times a day (BID) | ORAL | 0 refills | Status: AC

## 2017-10-25 MED ORDER — ONDANSETRON HCL 4 MG PO TABS: 4.0000 mg | ORAL_TABLET | Freq: Three times a day (TID) | ORAL | 0 refills | Status: AC | PRN

## 2017-10-25 MED ORDER — ESOMEPRAZOLE MAGNESIUM 20 MG PO CPDR: 20.0000 mg | DELAYED_RELEASE_CAPSULE | Freq: Two times a day (BID) | ORAL | 1 refills | Status: AC

## 2017-10-25 NOTE — Progress Notes (Signed)
Patient alert and oriented, Post op day 1.  Provided support and encouragement.  Encouraged pulmonary toilet, ambulation and small sips of liquids.  Completed 12 ounces of bari clear fluids and started protein.  All questions answered.  Will continue to monitor. 

## 2017-10-25 NOTE — Plan of Care (Signed)

## 2017-10-25 NOTE — Progress Notes (Signed)
Patient alert and oriented, pain is controlled. Patient is tolerating fluids, advanced to protein shake today, patient is tolerating well. Reviewed Gastric Bypass discharge instructions with patient and patient is able to articulate understanding. Provided information on BELT program, Support Group and [facility] outpatient pharmacy. All questions answered, will continue to monitor.   Total fluid intake 960 Per dehydration protocol call back one week post op 

## 2017-10-25 NOTE — Discharge Summary (Signed)
Physician Discharge Summary  Ernest MallickMamie A Grisanti WUJ:[id number] DOB: 1971-03-23 DOA: 10/24/2017  PCP: Gretel AcreNnodi, Adaku, MD  Admit date: 10/24/2017 Discharge date: 10/25/2017  Recommendations for Outpatient Follow-up:    Follow-up Information    Berna Bueonnor, Valeria Boza A, MD. Go on 11/10/2017.   Specialty:  General Surgery Why:  at 920 am Contact information: [address] [place] [address] [phone]        Berna Bueonnor, Khandi Kernes A, MD .   Specialty:  General Surgery Contact information: [address] [place] [address] [phone]          Discharge Diagnoses:  Active Problems:   Morbid obesity, weight 307, BMI - 54.5   Surgical Procedure: Laparoscopic Roux-en-Y gastric bypass, upper endoscopy  Discharge Condition: Good Disposition: Home  Diet recommendation: Postoperative gastric bypass diet  Filed Weights   10/24/17 0600 10/25/17 0540  Weight: (!) 147.1 kg (324 lb 6 oz) (!) 151.1 kg (333 lb 3.2 oz)     Hospital Course:  The patient was admitted for a planned laparoscopic Roux-en-Y gastric bypass. Please see operative note. Preoperatively the patient was given lovenox for DVT prophylaxis. ERAS protocol was used. Postoperative prophylactic Lovenox dosing was started on the evening of postoperative day 0.  The patient was started on ice chips and water on the evening of POD 0 which they tolerated. On postoperative day 1 the patient's diet was advanced to protein shakes which they also tolerated. The patient was ambulating without difficulty. Their vital signs are stable without fever or tachycardia. Their hemoglobin had remained stable. The patient had received discharge instructions and counseling. They were deemed stable for discharge.  BP (!) 150/82 (BP Location: Right Arm)   Pulse 66   Temp 97.9 F (36.6 C) (Oral)   Resp 14   Ht 5\' 3"  (1.6 m)   Wt (!) 151.1 kg (333 lb 3.2 oz)   LMP 10/03/2017 (Exact Date) Comment: urine  preg.negative, 10/24/17  SpO2 99%   BMI 59.02 kg/m   Gen: alert, NAD, non-toxic appearing Pupils: equal, no scleral icterus Pulm: Lungs clear to auscultation, symmetric chest rise CV: regular rate and rhythm Abd: soft, min tender, nondistended. No cellulitis. No incisional hernia Ext: no edema, no calf tenderness Skin: no rash, no jaundice  Discharge Instructions  Discharge Instructions    Ambulate hourly while awake   Complete by:  As directed    Call MD for:  difficulty breathing, headache or visual disturbances   Complete by:  As directed    Call MD for:  persistant dizziness or light-headedness   Complete by:  As directed    Call MD for:  persistant nausea and vomiting   Complete by:  As directed    Call MD for:  redness, tenderness, or signs of infection (pain, swelling, redness, odor or green/yellow discharge around incision site)   Complete by:  As directed    Call MD for:  severe uncontrolled pain   Complete by:  As directed    Call MD for:  temperature >101 F   Complete by:  As directed    Diet bariatric full liquid   Complete by:  As directed    Incentive spirometry   Complete by:  As directed    Perform hourly while awake     Allergies as of 10/25/2017      Reactions   Lamisil [terbinafine] Hives      Medication List    STOP taking these medications   acetaminophen  650 MG CR tablet Commonly known as:  TYLENOL   celecoxib 200 MG capsule Commonly known as:  CELEBREX   chlorthalidone 25 MG tablet Commonly known as:  HYGROTON   OSTEO BI-FLEX JOINT SHIELD PO     TAKE these medications   docusate sodium 100 MG capsule Commonly known as:  COLACE Take 1 capsule (100 mg total) by mouth 2 (two) times daily.   esomeprazole 20 MG capsule Commonly known as:  NEXIUM Take 1 capsule (20 mg total) by mouth 2 (two) times daily before a meal.   ondansetron 4 MG tablet Commonly known as:  ZOFRAN Take 1 tablet (4 mg total) by mouth every 8 (eight) hours as  needed for nausea or vomiting.   oxyCODONE 5 MG/5ML solution Commonly known as:  ROXICODONE Take 5-10 mLs (5-10 mg total) by mouth every 4 (four) hours as needed for moderate pain or severe pain (please give non-narcotics first).      Follow-up Information    Berna Bue, MD. Go on 11/10/2017.   Specialty:  General Surgery Why:  at 920 am Contact information: [address] [place] [address] [phone]        Berna Bue, MD .   Specialty:  General Surgery Contact information: [address] [place] [address] [phone]            The results of significant diagnostics from this hospitalization (including imaging, microbiology, ancillary and laboratory) are listed below for reference.    Significant Diagnostic Studies: No results found.  Labs: Basic Metabolic Panel: Recent Labs  Lab 10/19/17 1449 10/25/17 0409  NA 138 135  K 4.0 4.2  CL 106 101  CO2 24 27  GLUCOSE 98 121*  BUN 21* 13  CREATININE 0.66 0.80  CALCIUM 8.4* 8.1*   Liver Function Tests: Recent Labs  Lab 10/19/17 1449 10/25/17 0409  AST 25 118*  ALT 21 95*  ALKPHOS 68 65  BILITOT 0.2* 0.6  PROT 7.1 6.7  ALBUMIN 3.6 3.3*    CBC: Recent Labs  Lab 10/19/17 1449 10/25/17 0409  WBC 8.0 15.9*  NEUTROABS 5.2 12.8  HGB 15.1* 13.6  HCT 44.3 41.4  MCV 91.5 93.2  PLT 394 395    CBG: No results for input(s): GLUCAP in the last 168 hours.  Active Problems:   Morbid obesity, weight 307, BMI - 54.5   Signed:  Berna Bue MD [facility], [place] [phone] 10/25/2017, 12:20 PM

## 2017-10-25 NOTE — Progress Notes (Signed)
S: Slept fairly well. Pain controlled, subxyphoid is most tender. Transient nausea after getting dilaudid last night but otherwise none. Doing well with clears and protein shakes. Walking and using IS.   Vitals, labs, intake/output, and orders reviewed at this time. Afebrile. HR 64-84. Normotensive. Sats 97% room air.  PO 600; UOP 1815.  CMP with expected mild increase LFT from liver retraction. CBC expected post op leukocytosis 15.9, Hgb 13.6 (preop 15.1).   Gen: A&Ox3, no distress  H&N: EOMI, atraumatic, neck supple Chest: unlabored respirations, RRR Abd: soft, appropriately mildly tender, nondistended, incisions c/d/i with steri strips. No hernia or mass.  Ext: warm, no edema Neuro: grossly normal  Lines/tubes/drains: PIV  A/P:  POD 1 RYGB, doing well. She is eager to go home today and has done well with PO intake, aggressive walking and pulmonary toilet. -Continue clears/ protein shakes -Continue to mobilize/ pulm toilet -Receiving lovenox for DVT ppx -Plan discharge later today if continuing on this trajectory   Phylliss Blakeshelsea Ossiel Marchio, MD [facility], [place] Pager [phone]

## 2017-10-25 NOTE — Progress Notes (Signed)
Discharge instructions reviewed with patients. All questions answered. Patient ambulated to vehicle with belongings by nurse tech

## 2017-10-31 ENCOUNTER — Telehealth (HOSPITAL_COMMUNITY): Payer: Self-pay

## 2017-10-31 NOTE — Telephone Encounter (Signed)
Patient called to discuss post bariatric surgery follow up questions.  See below:   1.  Tell me about your pain and pain management?has not used any pain med since Friday. Taking tylenol for discomfort  2.  Let's talk about fluid intake.  How much total fluid are you taking in?51 ounces of fluid (40 ounces of water and 11 ounces of protein shake)  3.  How much protein have you taken in the last 2 days?30 grams  4.  Have you had nausea?  Tell me about when have experienced nausea and what you did to help?no nausea  5.  Has the frequency or color changed with your urine?urine light in color  6.  Tell me what your incisions look like?steri strips in place  7.  Have you been passing gas? BM?passing gas has had multiple bms since surgery  8.  If a problem or question were to arise who would you call?  Do you know contact numbers for [facility], [facility], and [facility]?is aware of how to contact all services  9.  How has the walking going?walking regularly does report she has not been walking every hour,  Encouraged to increase activity with explanation of decreasing risk of postop dvt  10.  How are your vitamins and calcium going?  How are you taking them?taking vitamin and calcium as directed  Concerns that her tongue felt raw or like thrush.  States is better has been putting yogurt to help which soothes.  Encouraged her to call surgeons office for follow up

## 2017-11-08 ENCOUNTER — Ambulatory Visit: Payer: BLUE CROSS/BLUE SHIELD

## 2017-11-09 ENCOUNTER — Encounter: Payer: Self-pay | Admitting: Skilled Nursing Facility1

## 2017-11-09 ENCOUNTER — Encounter: Payer: BLUE CROSS/BLUE SHIELD | Attending: Surgery | Admitting: Skilled Nursing Facility1

## 2017-11-09 DIAGNOSIS — Z713 Dietary counseling and surveillance: Secondary | ICD-10-CM | POA: Diagnosis not present

## 2017-11-09 NOTE — Patient Instructions (Addendum)
Eat breakfast between 5 am and 6:30 am: [place] Yogurt  9:30am: 1 cheese stick   12:30: [place]una with mayo   3:30: edamame or some protein shake  6:30: tuna    Call your insurance company   60 grams of protein before bed and 64 fluid ounces before bed

## 2017-11-09 NOTE — Progress Notes (Signed)
Bariatric Class:  Appt start time: 1530 end time:  1630.  2 Week Post-Operative Nutrition Class  Patient was seen on 11/09/2017 for Post-Operative Nutrition education at [facility].   Pt states she is taking colace due to not having a bowel movement for 3 days after surgery. Pt states she has been feeling good.  Pt states she has only been getting in 30 grams of protein per day for the last 2 weeks because she was thinking less is better. Pt states she thinks chewing food would be easier. Pt questioned if she could have fried fish.  Pt is struggling with understanding the importance of eating and needing food to fuel her body.   Surgery date: 10/24/2017 Surgery type: RYGB Start weight at [facility]: 322.7   TANITA  BODY COMP RESULTS  11/09/2017   BMI (kg/m^2) 60.5   Fat Mass (lbs) 165.2   Fat Free Mass (lbs) 150   Total Body Water (lbs) 112   Goals: Eat breakfast between 5 am and 6:30 am: [place] Yogurt 9:30am: 1 cheese stick  12:30: [place] with mayo  3:30: edamame or some protein shake 6:30: tuna  Call your insurance company for a mental health provider  60 grams of protein before bed and 64 fluid ounces before bed  The following the learning objectives were met by the patient during this course:  Identifies Phase 3A (Soft, High Proteins) Dietary Goals and will begin from 2 weeks post-operatively to 2 months post-operatively  Identifies appropriate sources of fluids and proteins   States protein recommendations and appropriate sources post-operatively  Identifies the need for appropriate texture modifications, mastication, and bite sizes when consuming solids  Identifies appropriate multivitamin and calcium sources post-operatively  Describes the need for physical activity post-operatively and will follow MD recommendations  States when to call healthcare provider regarding medication questions or post-operative complications  Handouts given  during class include:  Phase 3A: Soft, High Protein Diet Handout  Follow-Up Plan: Patient will follow-up at [facility] in 6 weeks for 2 month post-op nutrition visit for diet advancement per MD.

## 2017-11-10 ENCOUNTER — Telehealth: Payer: Self-pay | Admitting: Skilled Nursing Facility1

## 2017-11-10 NOTE — Telephone Encounter (Signed)
Dietitian called pt to check in on her progress.  Pt states everything is going well and is getting her protein in and 64 fluid ounces.

## 2017-11-16 ENCOUNTER — Encounter: Payer: BLUE CROSS/BLUE SHIELD | Admitting: Skilled Nursing Facility1

## 2017-11-16 NOTE — Progress Notes (Signed)
Follow-up visit:  3 Weeks Post-Operative RYGB Surgery  Primary concerns today: Post-operative Bariatric Surgery Nutrition Management.  Pt states she has been working the past 2 days. Pt states she is having leg swelling. Pt states she takes a fluid pill to get the fluid off. Pt states her energy has been great. Pt states she has used diet pills in the past to increase her energy :Dietian advised against this behavior. Pt states she wakes one time in the night to urinate.   Surgery date: 10/24/2017 Surgery type: RYGB [facility]   TANITA  BODY COMP RESULTS  11/09/2017   BMI (kg/m^2) 60.5   Fat Mass (lbs) 165.2   Fat Free Mass (lbs) 150   Total Body Water (lbs) 112    24-hr recall: B (AM): greek yogurt or egg with half a hot dog Snk (AM): half tuna pack with mayo L (PM): half tuna pack with mayo with almonds Snk (3PM): chicken  D (6PM): chicken with ketchup Snk (PM): 5 ounces of protein shake   Fluid intake: plain water, crystal light water: 64 Estimated total protein intake: 60+  Medications: see list Supplementation: celebrate multi and tums 3 times a day  Using straws: no Drinking while eating: no Having you been chewing well:no Chewing/swallowing difficulties: no Changes in vision: no Changes to mood/headaches: no Hair loss/Cahnges to skin/Changes to nails: no Any difficulty focusing or concentrating: no Sweating: no Dizziness/Lightheaded:  Palpitations: no no Carbonated beverages: no N/V/D/C/GAS: taking colace having a bowel movement every 2 days Abdominal Pain: no Dumping syndrome: no  Recent physical activity:  ADL's  Progress Towards Goal(s):  In progress.   Nutritional Diagnosis:  [place]-3.3 Overweight/obesity related to past poor dietary habits and physical inactivity as evidenced by patient w/ recent RYGB surgery following dietary guidelines for continued weight loss.  Intervention:  Nutrition counseling. Goals: -Start with raw cabbage:  chew extremely well -to have tried 4 new vegetables by next appointment: cook them in different ways until you like them  Teaching Method Utilized:  Visual Auditory Hands on  Barriers to learning/adherence to lifestyle change: none identified   Demonstrated degree of understanding via:  Teach Back   Monitoring/Evaluation:  Dietary intake, exercise, and body weight.

## 2017-12-28 ENCOUNTER — Ambulatory Visit: Payer: BLUE CROSS/BLUE SHIELD | Admitting: Skilled Nursing Facility1

## 2018-01-18 ENCOUNTER — Other Ambulatory Visit: Payer: Self-pay | Admitting: Pediatrics

## 2018-01-18 DIAGNOSIS — I10 Essential (primary) hypertension: Secondary | ICD-10-CM

## 2018-01-22 ENCOUNTER — Other Ambulatory Visit: Payer: Self-pay | Admitting: Pediatrics

## 2018-01-22 DIAGNOSIS — I10 Essential (primary) hypertension: Secondary | ICD-10-CM

## 2018-01-24 NOTE — Telephone Encounter (Signed)
Last seen 09/07/16  Dr Oswaldo Done  Needs to be seen

## 2018-01-24 NOTE — Telephone Encounter (Signed)
Patient aware and verbalizes understanding. 

## 2018-09-04 ENCOUNTER — Encounter (HOSPITAL_COMMUNITY): Payer: Self-pay

## 2019-04-30 ENCOUNTER — Encounter (HOSPITAL_COMMUNITY): Payer: Self-pay

## 2019-11-27 ENCOUNTER — Emergency Department (HOSPITAL_COMMUNITY)
Admission: EM | Admit: 2019-11-27 | Discharge: 2019-11-28 | Disposition: A | Discharge status: Home / Self Care | Payer: PRIVATE HEALTH INSURANCE | Attending: Emergency Medicine | Admitting: Emergency Medicine

## 2019-11-27 ENCOUNTER — Encounter (HOSPITAL_COMMUNITY): Payer: Self-pay | Admitting: Emergency Medicine

## 2019-11-27 ENCOUNTER — Other Ambulatory Visit: Payer: Self-pay

## 2019-11-27 ENCOUNTER — Emergency Department (HOSPITAL_COMMUNITY): Payer: PRIVATE HEALTH INSURANCE

## 2019-11-27 DIAGNOSIS — Y999 Unspecified external cause status: Secondary | ICD-10-CM | POA: Insufficient documentation

## 2019-11-27 DIAGNOSIS — I10 Essential (primary) hypertension: Secondary | ICD-10-CM | POA: Diagnosis not present

## 2019-11-27 DIAGNOSIS — Y9289 Other specified places as the place of occurrence of the external cause: Secondary | ICD-10-CM | POA: Diagnosis not present

## 2019-11-27 DIAGNOSIS — Y9389 Activity, other specified: Secondary | ICD-10-CM | POA: Insufficient documentation

## 2019-11-27 DIAGNOSIS — S50811A Abrasion of right forearm, initial encounter: Secondary | ICD-10-CM | POA: Insufficient documentation

## 2019-11-27 DIAGNOSIS — S40021A Contusion of right upper arm, initial encounter: Secondary | ICD-10-CM | POA: Insufficient documentation

## 2019-11-27 DIAGNOSIS — S59911A Unspecified injury of right forearm, initial encounter: Secondary | ICD-10-CM | POA: Diagnosis present

## 2019-11-27 DIAGNOSIS — Z87891 Personal history of nicotine dependence: Secondary | ICD-10-CM | POA: Diagnosis not present
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
## 2019-11-28 NOTE — Discharge Instructions (Addendum)
Use ice packs for comfort.  Take ibuprofen 600 mg plus acetaminophen 650 mg every 6 hours as needed for pain.  You may find that over the next week you may have bruising appear in your hand or fingers due to gravity from the bruising in your upper arm.  Do not be concerned about that.  Recheck if you get pain, coldness, or numbness in your fingers.
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
## 2020-05-13 ENCOUNTER — Encounter (HOSPITAL_COMMUNITY): Payer: Self-pay | Admitting: *Deleted

## 2021-05-22 ENCOUNTER — Encounter (HOSPITAL_COMMUNITY): Payer: Self-pay | Admitting: *Deleted

## 2022-05-17 ENCOUNTER — Encounter (HOSPITAL_COMMUNITY): Payer: Self-pay | Admitting: *Deleted

## 2023-07-30 LAB — EXTERNAL GENERIC LAB PROCEDURE: COLOGUARD: POSITIVE — AB

## 2023-08-15 ENCOUNTER — Encounter (INDEPENDENT_AMBULATORY_CARE_PROVIDER_SITE_OTHER): Payer: Self-pay | Admitting: *Deleted
